# Patient Record
Sex: Male | Born: 1970 | Race: White | Hispanic: No | Marital: Married | Smoking: Never smoker
Health system: Southern US, Community
[De-identification: ages and names within clinical notes are randomized; demographics above are authoritative.]

## PROBLEM LIST (undated history)

## (undated) DIAGNOSIS — K219 Gastro-esophageal reflux disease without esophagitis: Secondary | ICD-10-CM

## (undated) DIAGNOSIS — Z87442 Personal history of urinary calculi: Secondary | ICD-10-CM

## (undated) DIAGNOSIS — K5282 Eosinophilic colitis: Secondary | ICD-10-CM

## (undated) DIAGNOSIS — I10 Essential (primary) hypertension: Secondary | ICD-10-CM

## (undated) DIAGNOSIS — R011 Cardiac murmur, unspecified: Secondary | ICD-10-CM

## (undated) HISTORY — DX: Essential (primary) hypertension: I10

---

## 2007-06-20 ENCOUNTER — Emergency Department: Payer: Self-pay | Admitting: Emergency Medicine

## 2007-11-18 ENCOUNTER — Ambulatory Visit: Payer: Self-pay | Admitting: Family Medicine

## 2007-11-18 DIAGNOSIS — Z8679 Personal history of other diseases of the circulatory system: Secondary | ICD-10-CM | POA: Insufficient documentation

## 2007-11-18 DIAGNOSIS — I10 Essential (primary) hypertension: Secondary | ICD-10-CM

## 2007-11-23 LAB — CONVERTED CEMR LAB
AST: 18 units/L (ref 0–37)
Albumin: 4.2 g/dL (ref 3.5–5.2)
BUN: 12 mg/dL (ref 6–23)
Basophils Absolute: 0 10*3/uL (ref 0.0–0.1)
Basophils Relative: 0.3 % (ref 0.0–1.0)
Calcium: 9.3 mg/dL (ref 8.4–10.5)
Chloride: 106 meq/L (ref 96–112)
Cholesterol: 184 mg/dL (ref 0–200)
Creatinine, Ser: 1 mg/dL (ref 0.4–1.5)
Eosinophils Absolute: 0.1 10*3/uL (ref 0.0–0.7)
Eosinophils Relative: 1.6 % (ref 0.0–5.0)
GFR calc non Af Amer: 89 mL/min
HCT: 47.1 % (ref 39.0–52.0)
Hemoglobin: 15.7 g/dL (ref 13.0–17.0)
MCHC: 33.4 g/dL (ref 30.0–36.0)
MCV: 84.1 fL (ref 78.0–100.0)
Monocytes Absolute: 0.4 10*3/uL (ref 0.1–1.0)
Neutro Abs: 4.7 10*3/uL (ref 1.4–7.7)
RBC: 5.6 M/uL (ref 4.22–5.81)
TSH: 0.79 microintl units/mL (ref 0.35–5.50)
Total Bilirubin: 1.1 mg/dL (ref 0.3–1.2)
VLDL: 32 mg/dL (ref 0–40)
WBC: 6.4 10*3/uL (ref 4.5–10.5)

## 2007-12-23 ENCOUNTER — Ambulatory Visit: Payer: Self-pay | Admitting: Family Medicine

## 2008-03-23 ENCOUNTER — Ambulatory Visit: Payer: Self-pay | Admitting: Family Medicine

## 2008-03-23 LAB — CONVERTED CEMR LAB
Cholesterol: 175 mg/dL (ref 0–200)
HDL: 32.5 mg/dL — ABNORMAL LOW (ref >39.0)
VLDL: 21 mg/dL (ref 0–40)

## 2008-03-30 ENCOUNTER — Ambulatory Visit: Payer: Self-pay | Admitting: Family Medicine

## 2008-04-04 LAB — CONVERTED CEMR LAB
HCV Ab: NEGATIVE
Hep B C IgM: NEGATIVE
Hepatitis B Surface Ag: NEGATIVE

## 2008-06-22 ENCOUNTER — Ambulatory Visit: Payer: Self-pay | Admitting: Family Medicine

## 2008-10-11 ENCOUNTER — Encounter (INDEPENDENT_AMBULATORY_CARE_PROVIDER_SITE_OTHER): Payer: Self-pay | Admitting: *Deleted

## 2008-12-14 ENCOUNTER — Ambulatory Visit: Payer: Self-pay | Admitting: Family Medicine

## 2008-12-14 DIAGNOSIS — E7800 Pure hypercholesterolemia, unspecified: Secondary | ICD-10-CM

## 2008-12-15 LAB — CONVERTED CEMR LAB
ALT: 26 units/L (ref 0–53)
AST: 23 units/L (ref 0–37)
Albumin: 4.3 g/dL (ref 3.5–5.2)
Alkaline Phosphatase: 63 units/L (ref 39–117)
Bilirubin, Direct: 0.1 mg/dL (ref 0.0–0.3)
CO2: 31 meq/L (ref 19–32)
Chloride: 107 meq/L (ref 96–112)
Glucose, Bld: 94 mg/dL (ref 70–99)
Sodium: 143 meq/L (ref 135–145)
Total CHOL/HDL Ratio: 4
Total Protein: 6.6 g/dL (ref 6.0–8.3)
Triglycerides: 38 mg/dL (ref 0.0–149.0)

## 2008-12-28 ENCOUNTER — Ambulatory Visit: Payer: Self-pay | Admitting: Family Medicine

## 2011-11-10 ENCOUNTER — Emergency Department: Payer: Self-pay | Admitting: Emergency Medicine

## 2011-11-10 LAB — URINALYSIS, COMPLETE
Bacteria: NONE SEEN
Bilirubin,UR: NEGATIVE
Glucose,UR: NEGATIVE mg/dL (ref 0–75)
Ketone: NEGATIVE
Leukocyte Esterase: NEGATIVE
Ph: 5 (ref 4.5–8.0)
Specific Gravity: 1.005 (ref 1.003–1.030)
WBC UR: <1 /HPF (ref 0–5)

## 2012-02-07 ENCOUNTER — Emergency Department: Payer: Self-pay | Admitting: *Deleted

## 2012-02-07 LAB — CBC
HCT: 45.7 % (ref 40.0–52.0)
MCV: 83 fL (ref 80–100)
Platelet: 189 10*3/uL (ref 150–440)
RBC: 5.48 10*6/uL (ref 4.40–5.90)
WBC: 5.9 10*3/uL (ref 3.8–10.6)

## 2012-02-07 LAB — BASIC METABOLIC PANEL
BUN: 19 mg/dL — ABNORMAL HIGH (ref 7–18)
Creatinine: 1.2 mg/dL (ref 0.60–1.30)
EGFR (Non-African Amer.): >60
Glucose: 92 mg/dL (ref 65–99)
Osmolality: 281 (ref 275–301)
Potassium: 3.6 mmol/L (ref 3.5–5.1)

## 2012-02-12 ENCOUNTER — Ambulatory Visit: Payer: Self-pay | Admitting: Family Medicine

## 2012-09-05 ENCOUNTER — Encounter: Payer: Self-pay | Admitting: Family Medicine

## 2012-09-05 ENCOUNTER — Ambulatory Visit (INDEPENDENT_AMBULATORY_CARE_PROVIDER_SITE_OTHER): Payer: BC Managed Care – PPO | Admitting: Family Medicine

## 2012-09-05 VITALS — BP 130/78 | HR 90 | Temp 98.2°F | Ht 69.0 in | Wt 191.5 lb

## 2012-09-05 DIAGNOSIS — K120 Recurrent oral aphthae: Secondary | ICD-10-CM

## 2012-09-05 NOTE — Progress Notes (Signed)
   Nature conservation officer at [facility] [address] [place] [address] Phone: [phone] Fax: [phone]  Date:  09/05/2012   Name:  Kyle Johnson   DOB:  1971/03/10   MRN:  [id number] Gender: male Age: 42 y.o.  PCP:  Kerby Nora, MD  Evaluating MD: Hannah Beat, MD   Chief Complaint: Sore Throat   History of Present Illness:  Kyle Johnson is a 42 y.o. pleasant patient who presents with the following:  apthous ulcer - has been there a few days, it is already improving. Mild pain. No systemic sx  Patient Active Problem List  Diagnosis  . PURE HYPERCHOLESTEROLEMIA  . HYPERTENSION  . HEART MURMUR, HX OF    Past Medical History  Diagnosis Date  . Hypertension     No past surgical history on file.  History  Substance Use Topics  . Smoking status: Never Smoker   . Smokeless tobacco: Not on file  . Alcohol Use: Not on file    Family History  Problem Relation Age of Onset  . Hypertension Mother   . Stroke Mother   . Heart disease Maternal Grandfather   . Dementia Maternal Grandfather     Allergies  Allergen Reactions  . Aspirin     REACTION: Nausea and vomiting  . Milk-Related Compounds     REACTION: GI  . Penicillins     REACTION: Nausea and vomiting  . Propoxyphene Hcl     REACTION: Rash    Medication list has been reviewed and updated.  No outpatient prescriptions prior to visit.    Last reviewed on 09/05/2012 11:46 AM by Consuello Masse, CMA  Review of Systems:  ROS: GEN: Acute illness details above GI: Tolerating PO intake GU: maintaining adequate hydration and urination Pulm: No SOB Interactive and getting along well at home.  Otherwise, ROS is as per the HPI.   Physical Examination: BP 130/78  Pulse 90  Temp 98.2 F (36.8 C) (Oral)  Ht 5\' 9"  (1.753 m)  Wt 191 lb 8 oz (86.864 kg)  BMI 28.28 kg/m2  SpO2 97%  Ideal Body Weight: Weight in (lb) to have BMI = 25: 168.9    GEN: WDWN, NAD, Non-toxic, Alert &  Oriented x 3 HEENT: Atraumatic, Normocephalic. Oropharynx clear. Posterior oropharynx with small apthous ulcer, L posterior. Ears and Nose: No external deformity. EXTR: No clubbing/cyanosis/edema NEURO: Normal gait.  PSYCH: Normally interactive. Conversant. Not depressed or anxious appearing.  Calm demeanor.    Assessment and Plan:  1. Aphthous ulcer of mouth   reassurance  Orders Today:  No orders of the defined types were placed in this encounter.    Updated Medication List: (Includes new medications, updates to list, dose adjustments) No orders of the defined types were placed in this encounter.    Medications Discontinued: There are no discontinued medications.   Hannah Beat, MD

## 2012-11-24 ENCOUNTER — Encounter: Payer: Self-pay | Admitting: Family Medicine

## 2012-11-24 ENCOUNTER — Ambulatory Visit (INDEPENDENT_AMBULATORY_CARE_PROVIDER_SITE_OTHER): Payer: BC Managed Care – PPO | Admitting: Family Medicine

## 2012-11-24 VITALS — BP 120/84 | HR 86 | Temp 98.2°F | Ht 69.0 in | Wt 188.8 lb

## 2012-11-24 DIAGNOSIS — J309 Allergic rhinitis, unspecified: Secondary | ICD-10-CM

## 2012-11-24 DIAGNOSIS — J069 Acute upper respiratory infection, unspecified: Secondary | ICD-10-CM | POA: Insufficient documentation

## 2012-11-24 MED ORDER — MUCINEX DM 30-600 MG PO TB12: 1.0000 | ORAL_TABLET | Freq: Two times a day (BID) | ORAL | Status: DC

## 2012-11-24 MED ORDER — CETIRIZINE HCL 10 MG PO CAPS: 1.0000 | ORAL_CAPSULE | Freq: Every day | ORAL | Status: DC

## 2012-11-24 NOTE — Addendum Note (Signed)
Addended by: Consuello Masse on: 11/24/2012 10:24 AM   Modules accepted: Orders

## 2012-11-24 NOTE — Assessment & Plan Note (Signed)
Definitely a component of current illness. Start zyrtec at bedtime.

## 2012-11-24 NOTE — Patient Instructions (Addendum)
Mucinex DM during the day. Nasal saline spray 2 sprays per nostril daily.. Zyrtec at bedtime for allergy component. Call if not improving as expected, call sooner if shortness of breath.

## 2012-11-24 NOTE — Progress Notes (Signed)
  Subjective:    Patient ID: Kyle Johnson, male    DOB: 28-Jul-1971, 42 y.o.   MRN: [id number]  Sinus Problem This is a new problem. The current episode started in the past 7 days (2 days ago). The problem has been gradually worsening since onset. There has been no fever. Associated symptoms include congestion, coughing, headaches and sneezing. Pertinent negatives include no shortness of breath, sinus pressure or sore throat. (Post nasal drip  watery eyes, pressure in ears  chest heaviness) Past treatments include oral decongestants (benadryl sinus). The treatment provided moderate relief.      Review of Systems  HENT: Positive for congestion and sneezing. Negative for sore throat and sinus pressure.   Respiratory: Positive for cough. Negative for shortness of breath.   Neurological: Positive for headaches.       Objective:   Physical Exam  Constitutional: Vital signs are normal. He appears well-developed and well-nourished.  Non-toxic appearance. He does not appear ill. No distress.  HENT:  Head: Normocephalic and atraumatic.  Right Ear: Hearing, tympanic membrane, external ear and ear canal normal. No tenderness. No foreign bodies. Tympanic membrane is not retracted and not bulging.  Left Ear: Hearing, tympanic membrane, external ear and ear canal normal. No tenderness. No foreign bodies. Tympanic membrane is not retracted and not bulging.  Nose: Mucosal edema and rhinorrhea present. Right sinus exhibits no maxillary sinus tenderness and no frontal sinus tenderness. Left sinus exhibits no maxillary sinus tenderness and no frontal sinus tenderness.  Mouth/Throat: Uvula is midline, oropharynx is clear and moist and mucous membranes are normal. Normal dentition. No dental caries. No oropharyngeal exudate or tonsillar abscesses.  Post nasal drip  Eyes: Conjunctivae, EOM and lids are normal. Pupils are equal, round, and reactive to light. No foreign bodies found.  Neck: Trachea normal,  normal range of motion and phonation normal. Neck supple. Carotid bruit is not present. No mass and no thyromegaly present.  Cardiovascular: Normal rate, regular rhythm, S1 normal, S2 normal, normal heart sounds, intact distal pulses and normal pulses.  Exam reveals no gallop.   No murmur heard. Pulmonary/Chest: Effort normal and breath sounds normal. No respiratory distress. He has no wheezes. He has no rhonchi. He has no rales.  Abdominal: Soft. Normal appearance and bowel sounds are normal. There is no hepatosplenomegaly. There is no tenderness. There is no rebound, no guarding and no CVA tenderness. No hernia.  Neurological: He is alert. He has normal reflexes.  Skin: Skin is warm, dry and intact. No rash noted.  Psychiatric: He has a normal mood and affect. His speech is normal and behavior is normal. Judgment normal.          Assessment & Plan:

## 2012-11-24 NOTE — Assessment & Plan Note (Signed)
Symptomatic care. No sign of bacterial infection. 

## 2013-07-12 ENCOUNTER — Emergency Department: Payer: Self-pay | Admitting: Emergency Medicine

## 2013-07-12 ENCOUNTER — Ambulatory Visit (INDEPENDENT_AMBULATORY_CARE_PROVIDER_SITE_OTHER): Payer: BC Managed Care – PPO | Admitting: Family Medicine

## 2013-07-12 ENCOUNTER — Encounter: Payer: Self-pay | Admitting: Family Medicine

## 2013-07-12 VITALS — BP 140/90 | HR 69 | Temp 98.0°F | Ht 69.0 in | Wt 199.5 lb

## 2013-07-12 DIAGNOSIS — J323 Chronic sphenoidal sinusitis: Secondary | ICD-10-CM

## 2013-07-12 DIAGNOSIS — H6980 Other specified disorders of Eustachian tube, unspecified ear: Secondary | ICD-10-CM

## 2013-07-12 DIAGNOSIS — H6981 Other specified disorders of Eustachian tube, right ear: Secondary | ICD-10-CM

## 2013-07-12 MED ORDER — OXYMETAZOLINE HCL 0.05 % NA SOLN: 1.0000 | Freq: Two times a day (BID) | NASAL | Status: DC | PRN

## 2013-07-12 MED ORDER — AZITHROMYCIN 250 MG PO TABS: ORAL_TABLET | ORAL | Status: DC

## 2013-07-12 NOTE — Progress Notes (Signed)
Pre-visit discussion using our clinic review tool. No additional management support is needed unless otherwise documented below in the visit note.  

## 2013-07-12 NOTE — Progress Notes (Signed)
Date:  07/12/2013   Name:  Kyle Johnson   DOB:  23-Dec-1970   MRN:  [id number] Gender: male Age: 42 y.o.  Primary Physician:  Kerby Nora, MD   Chief Complaint: Otalgia and Jaw Pain   Subjective:   History of Present Illness:  Kyle Johnson is a 42 y.o. very pleasant male patient who presents with the following:  The patient has been sick the last week, and is having a lot of pain in his right ear, in his right upper maxillary region primarily and some behind his eyes and laterally. He has had no trauma. He is not running a fever. He used to drink normally. He is hearing some popping in his ear as well  R ear pain and in the ear pain. Thought maybe sinus  Teeth do not hurt.  O/w healthy  Sphenoid sinusitis R   Past Medical History, Surgical History, Social History, Family History, Problem List, Medications, and Allergies have been reviewed and updated if relevant.  Review of Systems: ROS: GEN: Acute illness details above GI: Tolerating PO intake GU: maintaining adequate hydration and urination Pulm: No SOB Interactive and getting along well at home.  Otherwise, ROS is as per the HPI.   Objective:   Physical Examination: BP 140/90  Pulse 69  Temp(Src) 98 F (36.7 C) (Oral)  Ht 5\' 9"  (1.753 m)  Wt 199 lb 8 oz (90.493 kg)  BMI 29.45 kg/m2   Gen: WDWN, NAD; alert,appropriate and cooperative throughout exam  HEENT: Normocephalic and atraumatic. Throat clear, w/o exudate, no LAD, R TM clear - some fluid, L TM - good landmarks, No fluid present. rhinnorhea.  Left frontal and maxillary sinuses: nonTender Right frontal and maxillary sinuses: Tender max, mild  Neck: No ant or post LAD CV: RRR, No M/G/R Pulm: Breathing comfortably in no resp distress. no w/c/r Abd: S,NT,ND,+BS Extr: no c/c/e Psych: full affect, pleasant    Laboratory and Imaging Data:  Assessment & Plan:    Sphenoid sinusitis  Eustachian tube dysfunction, right  . I suspect  he is having accommodation of eustachian tube dysfunction and likely some sphenoid sinusitis based on his presentation and location of his pain. Symptomatic care and by mouth antibiotics  Patient Instructions   Recommendations: Afrin Nasal Spray: 2 sprays twice a day for a maximum of 3-4 days (longer than this and your nose gets addicted, and you have rebound swelling that makes it worse.)  Sudafed: Either pseudoephedrine or phenylephrine. As directed on box. (Not is heart problems or high blood pressure)   Orders Today:  No orders of the defined types were placed in this encounter.    New medications, updates to list, dose adjustments: Meds ordered this encounter  Medications  . azithromycin (ZITHROMAX Z-PAK) 250 MG tablet    Sig: Take 2 tablets (500 mg) on  Day 1,  followed by 1 tablet (250 mg) once daily on Days 2 through 5.    Dispense:  6 each    Refill:  0  . oxymetazoline (AFRIN NASAL SPRAY) 0.05 % nasal spray    Sig: Place 1 spray into both nostrils 2 (two) times daily as needed for congestion.    Dispense:  30 mL    Refill:  2    Signed,  Latasha Buczkowski T. Chiara Coltrin, MD, CAQ Sports Medicine  [facility] [address] [place] [address] Phone: [phone] Fax: [phone]  Updated Complete Medication List:   Medication List  This list is accurate as of: 07/12/13  6:14 PM.  Always use your most recent med list.               azithromycin 250 MG tablet  Commonly known as:  ZITHROMAX Z-PAK  Take 2 tablets (500 mg) on  Day 1,  followed by 1 tablet (250 mg) once daily on Days 2 through 5.     Cetirizine HCl 10 MG Caps  Commonly known as:  ZYRTEC ALLERGY  Take 1 capsule (10 mg total) by mouth daily.     oxymetazoline 0.05 % nasal spray  Commonly known as:  AFRIN NASAL SPRAY  Place 1 spray into both nostrils 2 (two) times daily as needed for congestion.

## 2013-07-12 NOTE — Patient Instructions (Signed)
  Recommendations: Afrin Nasal Spray: 2 sprays twice a day for a maximum of 3-4 days (longer than this and your nose gets addicted, and you have rebound swelling that makes it worse.)  Sudafed: Either pseudoephedrine or phenylephrine. As directed on box. (Not is heart problems or high blood pressure)

## 2013-07-13 ENCOUNTER — Emergency Department: Payer: Self-pay | Admitting: Emergency Medicine

## 2013-07-13 LAB — COMPREHENSIVE METABOLIC PANEL
Albumin: 4.4 g/dL (ref 3.4–5.0)
Alkaline Phosphatase: 95 U/L
Anion Gap: 8 (ref 7–16)
Bilirubin,Total: 0.6 mg/dL (ref 0.2–1.0)
Calcium, Total: 9.4 mg/dL (ref 8.5–10.1)
Chloride: 103 mmol/L (ref 98–107)
Co2: 27 mmol/L (ref 21–32)
Creatinine: 0.98 mg/dL (ref 0.60–1.30)
EGFR (African American): >60
EGFR (Non-African Amer.): >60
Potassium: 3.9 mmol/L (ref 3.5–5.1)
SGOT(AST): 27 U/L (ref 15–37)
SGPT (ALT): 48 U/L (ref 12–78)
Sodium: 138 mmol/L (ref 136–145)
Total Protein: 7.9 g/dL (ref 6.4–8.2)

## 2013-07-13 LAB — CBC
MCH: 28.7 pg (ref 26.0–34.0)
MCHC: 34.5 g/dL (ref 32.0–36.0)
Platelet: 192 10*3/uL (ref 150–440)
RBC: 5.8 10*6/uL (ref 4.40–5.90)
WBC: 10 10*3/uL (ref 3.8–10.6)

## 2014-01-16 ENCOUNTER — Encounter (INDEPENDENT_AMBULATORY_CARE_PROVIDER_SITE_OTHER): Payer: Self-pay

## 2014-01-16 ENCOUNTER — Ambulatory Visit (INDEPENDENT_AMBULATORY_CARE_PROVIDER_SITE_OTHER): Payer: BC Managed Care – PPO | Admitting: Family Medicine

## 2014-01-16 ENCOUNTER — Encounter: Payer: Self-pay | Admitting: Family Medicine

## 2014-01-16 VITALS — BP 134/78 | HR 77 | Temp 97.8°F | Ht 68.75 in | Wt 188.8 lb

## 2014-01-16 DIAGNOSIS — T679XXA Effect of heat and light, unspecified, initial encounter: Secondary | ICD-10-CM | POA: Insufficient documentation

## 2014-01-16 LAB — BASIC METABOLIC PANEL
BUN: 22 mg/dL (ref 6–23)
CHLORIDE: 103 meq/L (ref 96–112)
CO2: 28 meq/L (ref 19–32)
Calcium: 9.3 mg/dL (ref 8.4–10.5)
Creatinine, Ser: 1.1 mg/dL (ref 0.4–1.5)
GFR: 81.48 mL/min (ref >60.00)
GLUCOSE: 141 mg/dL — AB (ref 70–99)
POTASSIUM: 3.5 meq/L (ref 3.5–5.1)
SODIUM: 139 meq/L (ref 135–145)

## 2014-01-16 NOTE — Patient Instructions (Signed)
Ask about keeping a cooler of ice water at work (on the truck). Drink enough to keep your urine clear or light colored.  Try get a pair of lightweight pants.  If you get too hot, then stop and get a cold rag on your neck.  Go to the lab on the way out.  We'll contact you with your lab report.

## 2014-01-16 NOTE — Assessment & Plan Note (Signed)
H/o cramping and likely relatively heavy salt load in perspiration.   See instructions and labs.   Normal exam today, sx resolved. Nontoxic.   D/w pt about prevention of sx.

## 2014-01-16 NOTE — Progress Notes (Signed)
Pre visit review using our clinic review tool, if applicable. No additional management support is needed unless otherwise documented below in the visit note.  He was out moving the grass about 1-2 weeks ago.  He was working long hours in the heat, "felt bad" afterward.  The next days he didn't feel well, felt tired and "exhausted."   Still didn't feel well by the following Monday but then sx resolved.    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  Felt overly sweaty.  Had to quit the work and put a cold rag on his neck.  Sat down in the shop, then started vomiting.  Got home last night.  Was able to rest last night, drank some gatorade.  This AM he did feel better.  No CP, SOB, BLE edema.  No LOC.    He tends to leave a salt ring on his hat from sweat.   Meds, vitals, and allergies reviewed.   ROS: See HPI.  Otherwise, noncontributory.  GEN: nad, alert and oriented HEENT: mucous membranes moist NECK: supple w/o LA CV: rrr.  no murmur PULM: ctab, no inc wob ABD: soft, +bs EXT: no edema SKIN: no acute rash

## 2014-01-17 ENCOUNTER — Encounter: Payer: Self-pay | Admitting: Family Medicine

## 2016-07-29 ENCOUNTER — Encounter: Payer: Self-pay | Admitting: Family Medicine

## 2016-07-29 ENCOUNTER — Ambulatory Visit (INDEPENDENT_AMBULATORY_CARE_PROVIDER_SITE_OTHER): Payer: BC Managed Care – PPO | Admitting: Family Medicine

## 2016-07-29 VITALS — BP 156/98 | HR 94 | Temp 98.4°F | Wt 202.8 lb

## 2016-07-29 DIAGNOSIS — I10 Essential (primary) hypertension: Secondary | ICD-10-CM | POA: Diagnosis not present

## 2016-07-29 DIAGNOSIS — R197 Diarrhea, unspecified: Secondary | ICD-10-CM | POA: Insufficient documentation

## 2016-07-29 LAB — COMPREHENSIVE METABOLIC PANEL
ALBUMIN: 4.9 g/dL (ref 3.5–5.2)
ALT: 31 U/L (ref 0–53)
AST: 19 U/L (ref 0–37)
Alkaline Phosphatase: 84 U/L (ref 39–117)
BUN: 15 mg/dL (ref 6–23)
CALCIUM: 9.6 mg/dL (ref 8.4–10.5)
CHLORIDE: 104 meq/L (ref 96–112)
CO2: 29 meq/L (ref 19–32)
CREATININE: 1.02 mg/dL (ref 0.40–1.50)
GFR: 83.31 mL/min (ref >60.00)
Glucose, Bld: 98 mg/dL (ref 70–99)
Potassium: 4.2 mEq/L (ref 3.5–5.1)
Sodium: 140 mEq/L (ref 135–145)
Total Bilirubin: 0.9 mg/dL (ref 0.2–1.2)
Total Protein: 7.2 g/dL (ref 6.0–8.3)

## 2016-07-29 LAB — CBC
HEMATOCRIT: 47.9 % (ref 39.0–52.0)
Hemoglobin: 16.4 g/dL (ref 13.0–17.0)
MCHC: 34.2 g/dL (ref 30.0–36.0)
MCV: 83.5 fl (ref 78.0–100.0)
PLATELETS: 216 10*3/uL (ref 150.0–400.0)
RBC: 5.74 Mil/uL (ref 4.22–5.81)
RDW: 12.8 % (ref 11.5–15.5)
WBC: 7.8 10*3/uL (ref 4.0–10.5)

## 2016-07-29 LAB — TSH: TSH: 1.74 u[IU]/mL (ref 0.35–4.50)

## 2016-07-29 LAB — HEMOGLOBIN A1C: HEMOGLOBIN A1C: 5.4 % (ref 4.6–6.5)

## 2016-07-29 MED ORDER — AMLODIPINE BESYLATE 5 MG PO TABS: 5.0000 mg | ORAL_TABLET | Freq: Every day | ORAL | 0 refills | Status: DC

## 2016-07-29 NOTE — Progress Notes (Signed)
  Tommi Rumps, MD Phone: [phone]  Kyle Johnson is a 45 y.o. male who presents today for same-day visit.  Patient notes onset of diarrhea 4 days ago. Notes some stomach upset with this though is no abdominal pain. No blood in stool. No nausea or vomiting. No new medications. Has not been drinking out of rivers or creeks. No sick contacts with this. Did eat some cookies from [employer] the day this started though no new foods. He notes he has progressively been improving. Still feels like his stomach is somewhat upset though his bowel movements are more formed today.  Hypertension: Patient intermittently rarely checks. Typically in the 170s over 80s when he does. No chest pain, shortness breath, swelling, numbness, weakness, or vision changes. Has had a mild headache last couple of days with the diarrhea. He is not on medications for his blood pressure.  PMH: nonsmoker.   ROS see history of present illness  Objective  Physical Exam Vitals:   07/29/16 1343 07/29/16 1410  BP: (!) 178/110 (!) 156/98  Pulse: 94   Temp: 98.4 F (36.9 C)     BP Readings from Last 3 Encounters:  07/29/16 (!) 156/98  01/16/14 134/78  07/12/13 140/90   Wt Readings from Last 3 Encounters:  07/29/16 202 lb 12.8 oz (92 kg)  01/16/14 188 lb 12 oz (85.6 kg)  07/12/13 199 lb 8 oz (90.5 kg)    Physical Exam  Constitutional: He is well-developed, well-nourished, and in no distress.  HENT:  Mouth/Throat: Oropharynx is clear and moist. No oropharyngeal exudate.  Eyes: Conjunctivae are normal. Pupils are equal, round, and reactive to light.  Cardiovascular: Normal rate, regular rhythm and normal heart sounds.   Pulmonary/Chest: Effort normal and breath sounds normal.  Abdominal: Soft. Bowel sounds are normal. He exhibits no distension. There is no tenderness. There is no rebound and no guarding.  Neurological: He is alert.  CN 2-12 intact, 5/5 strength in bilateral biceps, triceps, grip, quads,  hamstrings, plantar and dorsiflexion, sensation to light touch intact in bilateral UE and LE, normal gait, 2+ patellar reflexes  Skin: Skin is warm.     Assessment/Plan: Please see individual problem list.  Essential hypertension Uncontrolled. Has not been evaluated in a number of years. High at home as well. Currently asymptomatic. Neurologically intact. We'll check lab work as outlined below. We'll start on amlodipine. He'll follow-up in 2 weeks with his PCP for reevaluation.  Diarrhea Suspect patient's diarrhea likely related to food borne illness or viral illness given the relatively short duration and improvement at this time. Benign abdominal exam. We'll check lab work as outlined below. Encouraged hydration. If does not continue to improve he'll follow-up. Given return precautions.   Orders Placed This Encounter  Procedures  . Comp Met (CMET)  . CBC  . HgB A1c  . TSH    Meds ordered this encounter  Medications  . amLODipine (NORVASC) 5 MG tablet    Sig: Take 1 tablet (5 mg total) by mouth daily.    Dispense:  90 tablet    Refill:  0    Tommi Rumps, MD [place]

## 2016-07-29 NOTE — Progress Notes (Signed)
Pre visit review using our clinic review tool, if applicable. No additional management support is needed unless otherwise documented below in the visit note. 

## 2016-07-29 NOTE — Assessment & Plan Note (Signed)
Uncontrolled. Has not been evaluated in a number of years. High at home as well. Currently asymptomatic. Neurologically intact. We'll check lab work as outlined below. We'll start on amlodipine. He'll follow-up in 2 weeks with his PCP for reevaluation.

## 2016-07-29 NOTE — Assessment & Plan Note (Signed)
Suspect patient's diarrhea likely related to food borne illness or viral illness given the relatively short duration and improvement at this time. Benign abdominal exam. We'll check lab work as outlined below. Encouraged hydration. If does not continue to improve he'll follow-up. Given return precautions.

## 2016-07-29 NOTE — Patient Instructions (Signed)
Nice to meet you. We will start you on amlodipine for your blood pressure. We will check some lab work as well. Please monitor your diarrhea and if it does not continue to improve please let us know. I would like you to stay out of work until tomorrow and I provided you with a note for this. If you develop abdominal pain, blood in her stool, chest pain, shortness of breath, numbness, weakness, or any new or changing symptoms please seek medical attention immediately.

## 2016-07-30 ENCOUNTER — Ambulatory Visit: Payer: BC Managed Care – PPO | Admitting: Family Medicine

## 2016-08-14 ENCOUNTER — Encounter: Payer: Self-pay | Admitting: Family Medicine

## 2016-08-14 ENCOUNTER — Encounter: Payer: Self-pay | Admitting: *Deleted

## 2016-08-14 ENCOUNTER — Ambulatory Visit (INDEPENDENT_AMBULATORY_CARE_PROVIDER_SITE_OTHER): Payer: BC Managed Care – PPO | Admitting: Family Medicine

## 2016-08-14 VITALS — BP 138/82 | HR 69 | Temp 97.5°F | Wt 199.0 lb

## 2016-08-14 DIAGNOSIS — I10 Essential (primary) hypertension: Secondary | ICD-10-CM | POA: Diagnosis not present

## 2016-08-14 DIAGNOSIS — E7800 Pure hypercholesterolemia, unspecified: Secondary | ICD-10-CM

## 2016-08-14 LAB — LIPID PANEL
CHOLESTEROL: 190 mg/dL (ref 0–200)
HDL: 32.3 mg/dL — ABNORMAL LOW (ref >39.00)
LDL Cholesterol: 123 mg/dL — ABNORMAL HIGH (ref 0–99)
NonHDL: 158.04
Total CHOL/HDL Ratio: 6
Triglycerides: 173 mg/dL — ABNORMAL HIGH (ref 0.0–149.0)
VLDL: 34.6 mg/dL (ref 0.0–40.0)

## 2016-08-14 NOTE — Patient Instructions (Signed)
Keep working on healthy eating, weight loss and regular exercise.  Follow blood pressure at home.. Goal 90-140/60-90.  Stop at lab on way out for cholesterol check.

## 2016-08-14 NOTE — Assessment & Plan Note (Signed)
Improved control on amlodipine. Encouraged exercise, weight loss, healthy eating habits.

## 2016-08-14 NOTE — Progress Notes (Signed)
Pre visit review using our clinic review tool, if applicable. No additional management support is needed unless otherwise documented below in the visit note. 

## 2016-08-14 NOTE — Assessment & Plan Note (Signed)
Due for recheck

## 2016-08-14 NOTE — Progress Notes (Signed)
   Subjective:    Patient ID: Kyle Johnson, male    DOB: 10-12-1970, 45 y.o.   MRN: [id number]  HPI  45 year old male presents for follow up blood pressure.  he was seen with elevated blood pressure ( as well as diarrhea) by Dr. Josephina Gip on 12/13. At that OV diarrhea felt to be acute GI illness likely viral in nature. He reports today that diarrhea is much better. Started on amlodipine 5 mg daily For HTN and evaluated with labs. Tsh, cbc, a1C nml, CMET nml  His blood pressure is better today.Marland Kitchen ahs not been able to check at home.  no chest  Pain, no SOB. NO SE to amlodipoine.. No edema. He had been anxious for last few years.. Feel much better on amlodipine. No depression, no SI. Working on job changes which should help.  He has been doing better with healthy eating and low salt diet. Planning on getting back to exercise. BP Readings from Last 3 Encounters:  08/14/16 138/82  07/29/16 (!) 156/98  01/16/14 134/78   Wt Readings from Last 3 Encounters:  08/14/16 199 lb (90.3 kg)  07/29/16 202 lb 12.8 oz (92 kg)  01/16/14 188 lb 12 oz (85.6 kg)   Body mass index is 29.6 kg/m.    Review of Systems  Constitutional: Negative for fatigue and fever.  HENT: Negative for ear pain.   Eyes: Negative for pain.  Respiratory: Negative for cough and shortness of breath.   Cardiovascular: Negative for chest pain, palpitations and leg swelling.       Objective:   Physical Exam  Constitutional: Vital signs are normal. He appears well-developed and well-nourished.  HENT:  Head: Normocephalic.  Right Ear: Hearing normal.  Left Ear: Hearing normal.  Nose: Nose normal.  Mouth/Throat: Oropharynx is clear and moist and mucous membranes are normal.  Neck: Trachea normal. Carotid bruit is not present. No thyroid mass and no thyromegaly present.  Cardiovascular: Normal rate, regular rhythm and normal pulses.  Exam reveals no gallop, no distant heart sounds and no friction rub.   No murmur  heard. No peripheral edema  Pulmonary/Chest: Effort normal and breath sounds normal. No respiratory distress.  Skin: Skin is warm, dry and intact. No rash noted.  Psychiatric: He has a normal mood and affect. His speech is normal and behavior is normal. Thought content normal.          Assessment & Plan:

## 2016-09-04 ENCOUNTER — Telehealth: Payer: Self-pay | Admitting: *Deleted

## 2016-09-04 MED ORDER — OSELTAMIVIR PHOSPHATE 75 MG PO CAPS: 75.0000 mg | ORAL_CAPSULE | Freq: Two times a day (BID) | ORAL | 0 refills | Status: DC

## 2016-09-04 NOTE — Telephone Encounter (Signed)
Patient notified and verbalized understanding. 

## 2016-09-04 NOTE — Telephone Encounter (Signed)
Pt not at risk for flu complications. Don't recommend preventative treatment.  I have sent in tamiflu Rx but only recommend he take if he develops flu symptoms of fever >101, cough or body aches.

## 2016-09-04 NOTE — Telephone Encounter (Signed)
Wife came in to office and had a positive flu. Was asking about tamiflu prophylactic for PT. Please call.

## 2016-09-23 ENCOUNTER — Encounter: Payer: Self-pay | Admitting: Internal Medicine

## 2016-09-23 ENCOUNTER — Ambulatory Visit (INDEPENDENT_AMBULATORY_CARE_PROVIDER_SITE_OTHER): Payer: BC Managed Care – PPO | Admitting: Internal Medicine

## 2016-09-23 VITALS — BP 130/82 | HR 92 | Temp 98.2°F | Wt 198.0 lb

## 2016-09-23 DIAGNOSIS — J069 Acute upper respiratory infection, unspecified: Secondary | ICD-10-CM

## 2016-09-23 DIAGNOSIS — B9789 Other viral agents as the cause of diseases classified elsewhere: Secondary | ICD-10-CM

## 2016-09-23 NOTE — Progress Notes (Signed)
HPI  Pt presents to the clinic today with c/o nasal congestion and sore throat. This started yesterday. He is blowing clear mucous out of his nose. He denies difficulty swallowing. He denies ear pain or cough. He denies fever, chills or body aches. He has not taken anything OTC for this. He has no history of allergies or breathing problems. He has no history of allergies or breathing problems. He may have had sick contacts.  Review of Systems        Past Medical History:  Diagnosis Date  . Hypertension     Family History  Problem Relation Age of Onset  . Hypertension Mother   . Stroke Mother   . Heart disease Maternal Grandfather   . Dementia Maternal Grandfather     Social History   Social History  . Marital status: Married    Spouse name: N/A  . Number of children: N/A  . Years of education: N/A   Occupational History  . Not on file.   Social History Main Topics  . Smoking status: Never Smoker  . Smokeless tobacco: Never Used  . Alcohol use No  . Drug use: No  . Sexual activity: Not on file   Other Topics Concern  . Not on file   Social History Narrative  . No narrative on file    Allergies  Allergen Reactions  . Aspirin     REACTION: Nausea and vomiting  . Ibuprofen Other (See Comments)    Pt reports he does not remember why he can not take   . Milk-Related Compounds     REACTION: GI  . Penicillins     REACTION: Nausea and vomiting  . Propoxyphene Hcl     REACTION: Rash     Constitutional: Denies headache, fatigue, fever or abrupt weight changes.  HEENT:  Positive nasal congestion, sore throat. Denies eye redness, eye pain, pressure behind the eyes, facial pain, ear pain, ringing in the ears, wax buildup, runny nose or bloody nose. Respiratory:  Denies cough, difficulty breathing or shortness of breath.  Cardiovascular: Denies chest pain, chest tightness, palpitations or swelling in the hands or feet.   No other specific complaints in a complete  review of systems (except as listed in HPI above).  Objective:   BP 130/82   Pulse 92   Temp 98.2 F (36.8 C) (Oral)   Wt 198 lb (89.8 kg)   SpO2 98%   BMI 29.45 kg/m  Wt Readings from Last 3 Encounters:  09/23/16 198 lb (89.8 kg)  08/14/16 199 lb (90.3 kg)  07/29/16 202 lb 12.8 oz (92 kg)     General: Appears his stated age,  in NAD. HEENT: Head: normal shape and size, no sinus tenderness noted; Ears: Tm's pink but intact, normal light reflex, + serous effusion bilaterally; Nose: mucosa pink and moist, septum midline; Throat/Mouth: + PND. Teeth present, mucosa pink and moist, no exudate noted, no lesions or ulcerations noted.  Neck: No cervical lymphadenopathy.  Cardiovascular: Normal rate and rhythm.  Pulmonary/Chest: Normal effort and positive vesicular breath sounds. No respiratory distress. No wheezes, rales or ronchi noted.       Assessment & Plan:   Viral Upper Respiratory Infection:  Get some rest and drink plenty of water Do salt water gargles for the sore throat Start Zyrtec and Flonase OTC  RTC as needed or if symptoms persist.   Webb Silversmith, NP

## 2016-09-23 NOTE — Patient Instructions (Signed)
Upper Respiratory Infection, Adult Most upper respiratory infections (URIs) are caused by a virus. A URI affects the nose, throat, and upper air passages. The most common type of URI is often called "the common cold." Follow these instructions at home:  Take medicines only as told by your doctor.  Gargle warm saltwater or take cough drops to comfort your throat as told by your doctor.  Use a warm mist humidifier or inhale steam from a shower to increase air moisture. This may make it easier to breathe.  Drink enough fluid to keep your pee (urine) clear or pale yellow.  Eat soups and other clear broths.  Have a healthy diet.  Rest as needed.  Go back to work when your fever is gone or your doctor says it is okay.  You may need to stay home longer to avoid giving your URI to others.  You can also wear a face mask and wash your hands often to prevent spread of the virus.  Use your inhaler more if you have asthma.  Do not use any tobacco products, including cigarettes, chewing tobacco, or electronic cigarettes. If you need help quitting, ask your doctor. Contact a doctor if:  You are getting worse, not better.  Your symptoms are not helped by medicine.  You have chills.  You are getting more short of breath.  You have brown or red mucus.  You have yellow or brown discharge from your nose.  You have pain in your face, especially when you bend forward.  You have a fever.  You have puffy (swollen) neck glands.  You have pain while swallowing.  You have white areas in the back of your throat. Get help right away if:  You have very bad or constant:  Headache.  Ear pain.  Pain in your forehead, behind your eyes, and over your cheekbones (sinus pain).  Chest pain.  You have long-lasting (chronic) lung disease and any of the following:  Wheezing.  Long-lasting cough.  Coughing up blood.  A change in your usual mucus.  You have a stiff neck.  You have  changes in your:  Vision.  Hearing.  Thinking.  Mood. This information is not intended to replace advice given to you by your health care provider. Make sure you discuss any questions you have with your health care provider. Document Released: 01/20/2008 Document Revised: 04/05/2016 Document Reviewed: 11/08/2013 Elsevier Interactive Patient Education  2017 [employer] Inc.  

## 2016-09-24 ENCOUNTER — Telehealth: Payer: Self-pay

## 2016-09-24 NOTE — Telephone Encounter (Signed)
Patient returned Melanie's call. Patient can be reached at [phone].

## 2016-09-24 NOTE — Telephone Encounter (Signed)
Patient calls to request "more medication" to treat symptoms as they have changed since office visit yesterday.  Patient now complains of: Chest congestion (worse in am).  Productive cough with thick yellow sputum.  Denies fever, sore throat, head ache or body aches.  He would like to take Robitussin DM to help with cough and expectorating mucus from chest.    I did suggest either Robtiussin or Mucinex but he states he doesn't think mucinex works as well for him.  In addition, I encouraged him to drink plenty of fluids, especially water, to thin secretions to ease in expectorating.  Will forward to Webb Silversmith, NP (as she saw patient yesterday) for her recommendations and call patient back with any further direction.

## 2016-09-24 NOTE — Telephone Encounter (Signed)
Pt is aware as instructed 

## 2016-09-24 NOTE — Telephone Encounter (Signed)
Called pt, no answer and VM not set up 

## 2016-09-24 NOTE — Telephone Encounter (Signed)
My recommendations today are the same as they were yesterday. Use Flonase for the nasal congestion, Zyrtec to help dry up the mucous. And he is fine to take Robitussin DM if that is what he choose for cough syrup.

## 2016-10-19 ENCOUNTER — Other Ambulatory Visit: Payer: Self-pay | Admitting: *Deleted

## 2016-10-19 MED ORDER — AMLODIPINE BESYLATE 5 MG PO TABS: 5.0000 mg | ORAL_TABLET | Freq: Every day | ORAL | 1 refills | Status: DC

## 2016-11-06 ENCOUNTER — Ambulatory Visit (INDEPENDENT_AMBULATORY_CARE_PROVIDER_SITE_OTHER): Payer: BC Managed Care – PPO | Admitting: Family Medicine

## 2016-11-06 ENCOUNTER — Encounter: Payer: Self-pay | Admitting: Family Medicine

## 2016-11-06 ENCOUNTER — Encounter (INDEPENDENT_AMBULATORY_CARE_PROVIDER_SITE_OTHER): Payer: Self-pay

## 2016-11-06 VITALS — BP 136/72 | HR 92 | Temp 98.4°F | Ht 69.25 in | Wt 194.5 lb

## 2016-11-06 DIAGNOSIS — E7800 Pure hypercholesterolemia, unspecified: Secondary | ICD-10-CM | POA: Diagnosis not present

## 2016-11-06 DIAGNOSIS — Z23 Encounter for immunization: Secondary | ICD-10-CM | POA: Diagnosis not present

## 2016-11-06 DIAGNOSIS — R0789 Other chest pain: Secondary | ICD-10-CM | POA: Insufficient documentation

## 2016-11-06 DIAGNOSIS — Z0000 Encounter for general adult medical examination without abnormal findings: Secondary | ICD-10-CM

## 2016-11-06 DIAGNOSIS — I10 Essential (primary) hypertension: Secondary | ICD-10-CM

## 2016-11-06 DIAGNOSIS — Z0001 Encounter for general adult medical examination with abnormal findings: Secondary | ICD-10-CM | POA: Diagnosis not present

## 2016-11-06 NOTE — Patient Instructions (Addendum)
Keep working on healthy eating and regular exercise. Start chest wall stretching. Can use tyelnol for pain if needed.  Call if chest wall pain not improving as expected.

## 2016-11-06 NOTE — Assessment & Plan Note (Signed)
Tylenol and home stretching.  lung exam clear pt, VSS.

## 2016-11-06 NOTE — Progress Notes (Signed)
   Subjective:    Patient ID: Kyle Johnson, male    DOB: 1971-06-13, 46 y.o.   MRN: [id number]  HPI  The patient is here for annual wellness exam and preventative care.   He had new chest pain in last 2 days, off and on in right axillae. No related to exertion. No SOB, no dizziness. Occ cough. No fever. Does not hurt to take deep breaths.  No change with movement.   He has been having allergies in last month.Started zyrtec for nasal congestion.  Hypertension:    Good control on amlodipine 5 mg daily. BP Readings from Last 3 Encounters:  11/06/16 136/72  09/23/16 130/82  08/14/16 138/82  Using medication without problems or lightheadedness: none Chest pain with exertion:None Edema:none Short of breath:none Average home BPs: Other issues:  Working on healthy eating and exercise. Decreasing pork and bacon.  Social History /Family History/Past Medical History reviewed and updated if needed. Blood pressure 136/72, pulse 92, temperature 98.4 F (36.9 C), temperature source Oral, height 5' 9.25" (1.759 m), weight 194 lb 8 oz (88.2 kg).  Review of Systems  Constitutional: Negative for fatigue and fever.  HENT: Negative for ear pain.   Eyes: Negative for pain.  Respiratory: Negative for cough and shortness of breath.   Cardiovascular: Positive for chest pain. Negative for palpitations and leg swelling.  Gastrointestinal: Negative for abdominal pain.  Genitourinary: Negative for dysuria.  Musculoskeletal: Negative for arthralgias.  Neurological: Negative for syncope, light-headedness and headaches.  Psychiatric/Behavioral: Negative for dysphoric mood.       Objective:   Physical Exam  Constitutional: Vital signs are normal. He appears well-developed and well-nourished.  HENT:  Head: Normocephalic.  Right Ear: Hearing normal.  Left Ear: Hearing normal.  Nose: Nose normal.  Mouth/Throat: Oropharynx is clear and moist and mucous membranes are normal.  Neck: Trachea  normal. Carotid bruit is not present. No thyroid mass and no thyromegaly present.  Cardiovascular: Normal rate, regular rhythm and normal pulses.  Exam reveals no gallop, no distant heart sounds and no friction rub.   No murmur heard. No peripheral edema  Pulmonary/Chest: Effort normal and breath sounds normal. No respiratory distress. He exhibits no mass, no tenderness and no bony tenderness.  Skin: Skin is warm, dry and intact. No rash noted.  Psychiatric: He has a normal mood and affect. His speech is normal and behavior is normal. Thought content normal.          Assessment & Plan:  The patient's preventative maintenance and recommended screening tests for an annual wellness exam were reviewed in full today. Brought up to date unless services declined.  Counselled on the importance of diet, exercise, and its role in overall health and mortality. The patient's FH and SH was reviewed, including their home life, tobacco status, and drug and alcohol status.    Vaccines: Given Tdap today Prostate Cancer Screen: no early fam history Colon Cancer Screen: no early fam history      Smoking Status: none ETOH/ drug use: none   HIV screen:   refused.

## 2016-11-06 NOTE — Assessment & Plan Note (Signed)
Well controlled. Continue current medication.  

## 2016-11-06 NOTE — Progress Notes (Signed)
Pre visit review using our clinic review tool, if applicable. No additional management support is needed unless otherwise documented below in the visit note. 

## 2016-11-06 NOTE — Assessment & Plan Note (Signed)
Encouraged exercise, weight loss, healthy eating habits. ? ?

## 2016-11-10 ENCOUNTER — Telehealth: Payer: Self-pay | Admitting: Family Medicine

## 2016-11-10 NOTE — Telephone Encounter (Signed)
Patient Name: Kyle Johnson\ DOB: [birth year] Initial Comment Caller states husband was seen Friday for Tetnis shot. Now he is sick at stomach and arm swollen at site, feeling like a huge knot is under the skin. Nurse Assessment Nurse: Emilio Math, RN, Estill Bamberg Date/Time (Eastern Time): 11/10/2016 11:50:34 AM Confirm and document reason for call. If symptomatic, describe symptoms. ---Caller states husband was in Friday for physical. He received a tetanus shot. Saturday, noticed a red knot to injection site. Progressively gotten worse. This am experiencing nausea. Denies any other symptoms. Does the patient have any new or worsening symptoms? ---Yes Will a triage be completed? ---Yes Related visit to physician within the last 2 weeks? ---Yes Does the PT have any chronic conditions? (i.e. diabetes, asthma, etc.) ---Yes List chronic conditions. ---heart murmur Is this a behavioral health or substance abuse call? ---No Guidelines Guideline Title Affirmed Question Affirmed Notes Immunization Reactions [1] Pain, tenderness, or swelling at the injection site AND [2] persists > 3 days Final Disposition User See PCP When Office is Open (within 3 days) Emilio Math, RN, Estill Bamberg Comments patient reports also experiencing diarrha (5-6 times today). Denies need to triage these symptoms at this time. Just concerned regarding injection site. Referrals REFERRED TO PCP OFFICE REFERRED TO PCP OFFICE Disagree/Comply: Comply

## 2016-11-10 NOTE — Telephone Encounter (Signed)
Pt has appt with Dr Deborra Medina on 11/11/16 at 9 AM.

## 2016-11-11 ENCOUNTER — Ambulatory Visit (INDEPENDENT_AMBULATORY_CARE_PROVIDER_SITE_OTHER): Payer: BC Managed Care – PPO | Admitting: Family Medicine

## 2016-11-11 ENCOUNTER — Encounter: Payer: Self-pay | Admitting: Family Medicine

## 2016-11-11 DIAGNOSIS — T50A15A Adverse effect of pertussis vaccine, including combinations with a pertussis component, initial encounter: Secondary | ICD-10-CM | POA: Insufficient documentation

## 2016-11-11 NOTE — Progress Notes (Signed)
Subjective:   Patient ID: Kyle Johnson, male    DOB: 31-Oct-1970, 46 y.o.   MRN: [id number]  Kyle Johnson is a pleasant 46 y.o. year old male who presents to clinic today with Arm Pain (Pt stated have tetanus shot and arm start having redness, hot to touch)  on 11/11/2016  HPI:  Tetanus shot received here on 11/06/16 ( 5 days ago).  The day after receiving the Tdap immunization here, noticed a painful, red knot at the injection site.  Feels it is getting worse.  Had some nausea last night.  Area is now also red and warm to touch.  Symptoms are getting better.  Less red, less painful but it is itchy.  Has not taken anything for it. Current Outpatient Prescriptions on File Prior to Visit  Medication Sig Dispense Refill  . amLODipine (NORVASC) 5 MG tablet Take 1 tablet (5 mg total) by mouth daily. 90 tablet 1   No current facility-administered medications on file prior to visit.     Allergies  Allergen Reactions  . Aspirin     REACTION: Nausea and vomiting  . Ibuprofen Other (See Comments)    Pt reports he does not remember why he can not take   . Milk-Related Compounds     REACTION: GI  . Penicillins     REACTION: Nausea and vomiting  . Propoxyphene Hcl     REACTION: Rash  . Tetanus Toxoids     Past Medical History:  Diagnosis Date  . Hypertension     No past surgical history on file.  Family History  Problem Relation Age of Onset  . Hypertension Mother   . Stroke Mother   . Heart disease Maternal Grandfather   . Dementia Maternal Grandfather     Social History   Social History  . Marital status: Married    Spouse name: N/A  . Number of children: N/A  . Years of education: N/A   Occupational History  . Not on file.   Social History Main Topics  . Smoking status: Never Smoker  . Smokeless tobacco: Never Used  . Alcohol use No  . Drug use: No  . Sexual activity: Not on file   Other Topics Concern  . Not on file   Social History  Narrative  . No narrative on file   The PMH, PSH, Social History, Family History, Medications, and allergies have been reviewed in [facility], and have been updated if relevant.   Review of Systems  Constitutional: Negative.   HENT: Negative.   Respiratory: Negative.   Musculoskeletal: Positive for myalgias.  Skin: Positive for rash.  Neurological: Negative.   Hematological: Negative.   Psychiatric/Behavioral: Negative.   All other systems reviewed and are negative.      Objective:    BP (!) 142/84   Pulse 87   Temp 98.1 F (36.7 C)   Ht 5\' 9"  (1.753 m)   Wt 191 lb (86.6 kg)   SpO2 99%   BMI 28.21 kg/m    Physical Exam  Constitutional: He is oriented to person, place, and time. He appears well-developed and well-nourished. No distress.  HENT:  Head: Normocephalic and atraumatic.  Eyes: Conjunctivae are normal.  Cardiovascular: Normal rate.   Pulmonary/Chest: Effort normal.  Musculoskeletal: Normal range of motion.  Neurological: He is alert and oriented to person, place, and time. No cranial nerve deficit.  Skin: He is not diaphoretic.     Psychiatric: He has a normal mood and  affect. His behavior is normal. Judgment and thought content normal.  Nursing note and vitals reviewed.         Assessment & Plan:   No diagnosis found. No Follow-up on file.

## 2016-11-11 NOTE — Patient Instructions (Signed)
Great to see you.  Take Benadryl or zyrtec for next few days to help with itching and inflammation.  Please keep [place] updated.

## 2016-11-11 NOTE — Assessment & Plan Note (Signed)
Improving. Per pt, erythema and warmth "covered half" his arm. Localized reaction but itchiness and extensiveness of reaction is concerning for an allergic reaction. Will add tdap to allergy list. Advised as needed antihistamines. Call or return to clinic prn if these symptoms worsen or fail to improve as anticipated. The patient indicates understanding of these issues and agrees with the plan.

## 2017-04-18 ENCOUNTER — Other Ambulatory Visit: Payer: Self-pay | Admitting: Family Medicine

## 2017-04-28 ENCOUNTER — Emergency Department
Admission: EM | Admit: 2017-04-28 | Discharge: 2017-04-28 | Disposition: A | Discharge status: Home / Self Care | Payer: BC Managed Care – PPO | Attending: Emergency Medicine | Admitting: Emergency Medicine

## 2017-04-28 ENCOUNTER — Telehealth: Payer: Self-pay | Admitting: Family Medicine

## 2017-04-28 ENCOUNTER — Encounter: Payer: Self-pay | Admitting: *Deleted

## 2017-04-28 DIAGNOSIS — R112 Nausea with vomiting, unspecified: Secondary | ICD-10-CM | POA: Diagnosis not present

## 2017-04-28 DIAGNOSIS — R197 Diarrhea, unspecified: Secondary | ICD-10-CM | POA: Diagnosis not present

## 2017-04-28 DIAGNOSIS — I10 Essential (primary) hypertension: Secondary | ICD-10-CM | POA: Diagnosis not present

## 2017-04-28 DIAGNOSIS — Z79899 Other long term (current) drug therapy: Secondary | ICD-10-CM | POA: Diagnosis not present

## 2017-04-28 LAB — CBC
HCT: 48.5 % (ref 40.0–52.0)
HEMOGLOBIN: 16.7 g/dL (ref 13.0–18.0)
MCH: 28.6 pg (ref 26.0–34.0)
MCHC: 34.5 g/dL (ref 32.0–36.0)
MCV: 83 fL (ref 80.0–100.0)
PLATELETS: 219 10*3/uL (ref 150–440)
RBC: 5.84 MIL/uL (ref 4.40–5.90)
RDW: 13 % (ref 11.5–14.5)
WBC: 15.8 10*3/uL — AB (ref 3.8–10.6)

## 2017-04-28 LAB — COMPREHENSIVE METABOLIC PANEL
ALBUMIN: 4.5 g/dL (ref 3.5–5.0)
ALK PHOS: 82 U/L (ref 38–126)
ALT: 25 U/L (ref 17–63)
ANION GAP: 8 (ref 5–15)
AST: 23 U/L (ref 15–41)
BUN: 19 mg/dL (ref 6–20)
CALCIUM: 9.4 mg/dL (ref 8.9–10.3)
CO2: 26 mmol/L (ref 22–32)
Chloride: 105 mmol/L (ref 101–111)
Creatinine, Ser: 1.05 mg/dL (ref 0.61–1.24)
GFR calc Af Amer: >60 mL/min (ref >60)
GFR calc non Af Amer: >60 mL/min (ref >60)
Glucose, Bld: 121 mg/dL — ABNORMAL HIGH (ref 65–99)
Potassium: 4.1 mmol/L (ref 3.5–5.1)
SODIUM: 139 mmol/L (ref 135–145)
Total Bilirubin: 1 mg/dL (ref 0.3–1.2)
Total Protein: 7.2 g/dL (ref 6.5–8.1)

## 2017-04-28 LAB — LIPASE, BLOOD: Lipase: 24 U/L (ref 11–51)

## 2017-04-28 MED ORDER — PANTOPRAZOLE SODIUM 40 MG IV SOLR
40.0000 mg | Freq: Once | INTRAVENOUS | Status: AC
  Administered 2017-04-28: 40 mg via INTRAVENOUS
  Filled 2017-04-28: qty 40

## 2017-04-28 MED ORDER — ONDANSETRON 4 MG PO TBDP: ORAL_TABLET | ORAL | 0 refills | Status: DC

## 2017-04-28 MED ORDER — SODIUM CHLORIDE 0.9 % IV BOLUS (SEPSIS)
1000.0000 mL | INTRAVENOUS | Status: AC
  Administered 2017-04-28: 1000 mL via INTRAVENOUS

## 2017-04-28 MED ORDER — ONDANSETRON HCL 4 MG/2ML IJ SOLN
4.0000 mg | INTRAMUSCULAR | Status: AC
  Administered 2017-04-28: 4 mg via INTRAVENOUS
  Filled 2017-04-28: qty 2

## 2017-04-28 NOTE — Discharge Instructions (Signed)

## 2017-04-28 NOTE — ED Triage Notes (Signed)
States this AM at 0600 he began vomiting, states he has vomited 10 times, states feeling tired from the vomiting, at present denies any pain, awake and alert in no acute distress

## 2017-04-28 NOTE — Telephone Encounter (Signed)
Patient Name: Kyle Johnson DOB: May 06, 1971 Initial Comment Caller states her husband has been vomiting since this morning, diarrhea and weak. Nurse Assessment Nurse: Joline Salt, RN, Malachy Mood Date/Time Eilene Ghazi Time): 04/28/2017 12:16:51 PM Confirm and document reason for call. If symptomatic, describe symptoms. ---Caller states her husband has been vomiting x3 since this morning, diarrhea and weak. Not sure if he has a fever. He is drinking water. Does the patient have any new or worsening symptoms? ---Yes Will a triage be completed? ---Yes Related visit to physician within the last 2 weeks? ---No Does the PT have any chronic conditions? (i.e. diabetes, asthma, etc.) ---No Is this a behavioral health or substance abuse call? ---No Guidelines Guideline Title Affirmed Question Affirmed Notes Vomiting MILD vomiting with diarrhea Final Disposition User [place], RN, [place] Disagree/Comply: Comply

## 2017-04-28 NOTE — Telephone Encounter (Signed)
PLEASE NOTE: All timestamps contained within this report are represented as [place] Standard Time. CONFIDENTIALTY NOTICE: This fax transmission is intended only for the addressee. It contains information that is legally privileged, confidential or otherwise protected from use or disclosure. If you are not the intended recipient, you are strictly prohibited from reviewing, disclosing, copying using or disseminating any of this information or taking any action in reliance on or regarding this information. If you have received this fax in error, please notify us immediately by telephone so that we can arrange for its return to [place]. Phone: [phone], Toll-Free: [phone], Fax: [phone] Page: 1 of 1 Call Id: [id number] [place] Patient Name: Kyle Johnson Gender: Male DOB: Jul 30, 1971 Age: 46 Y 104 M 17 D Return Phone Number: [phone] (Primary) City/State/Zip: [place] [address] Client [facility] - Client Client Site [place] - Day Physician Eliezer Lofts - MD Who Is Calling Patient / Member / Family / Caregiver Call Type Triage / Clinical Caller Name Sharyn Lull Relationship To Patient Spouse Return Phone Number [phone] (Primary) Chief Complaint Vomiting Reason for Call Symptomatic / Request for [place] states her husband has been vomiting since this morning, diarrhea and weak. Appointment Disposition EMR Appointment Not Necessary Info pasted into Epic Yes Nurse Assessment Nurse: Joline Salt, RN, Malachy Mood Date/Time Eilene Ghazi Time): 04/28/2017 12:16:51 PM Confirm and document reason for call. If symptomatic, describe symptoms. ---Caller states her husband has been vomiting x3 since this morning, diarrhea and weak. Not sure if he has a fever. He is drinking water. Does the PT have any chronic conditions? (i.e.  diabetes, asthma, etc.) ---No Guidelines Guideline Title Affirmed Question Vomiting MILD vomiting with diarrhea Disp. Time Eilene Ghazi Time) Disposition Final User 04/28/2017 12:27:45 [place], RN, [facility] Advice Given Per Guideline REASSURANCE AND EDUCATION: Vomiting and diarrhea are often caused by viral gastroenteritis (stomach flu) or mild food poisoning. Staying well-hydrated is the most important thing. CLEAR LIQUIDS: Try to sip small amounts (1 tablespoon or 15 ml) of liquid frequently (every 5 minutes) for 8 hours, rather than trying to drink a lot of liquid all at one time. * Sip water or a 1/2 strength sports drink (e.g., Gatorade or Powerade). * Other options: 1/2 strength flat lemon-lime soda or ginger ale. * After 4 hours without vomiting, increase the amount. SOLID FOOD: * You may begin eating bland foods after 8 hours without vomiting. * Start with saltine crackers, white bread, rice, mashed potatoes, cereal, applesauce, etc. * You can resume a normal diet in 24-48 hours. * Avoid NSAIDs, which can cause gastritis CALL BACK IF: * Vomiting lasts more than 2 days (48 hours) * Vomit contains bile (green color) * Diarrhea lasts more than 7 days * Constant stomach pain lasting more than 2 hours * Signs of dehydration (e.g., no urination over 12 hours, very lightheaded) * You become worse. CARE ADVICE per Vomiting (Adult) guideline.

## 2017-04-28 NOTE — ED Notes (Signed)
Pt provided ginger ale. Informed to sip and not chug.  States he is feeling better, HA is less now. Sitting on side of bed with blanket around shoulders.

## 2017-04-28 NOTE — ED Provider Notes (Signed)
[facility] Emergency Department Provider Note  ____________________________________________   First MD Initiated Contact with Patient 04/28/17 1507     (approximate)  I have reviewed the triage vital signs and the nursing notes.   HISTORY  Chief Complaint Emesis    HPI Kyle Johnson is a 46 y.o. male With relatively minimal chronic medical issues except for mild hypertension who presents for evaluation of persistent vomiting starting early this morning.  He states that he and his family ate the same meal last night but shortly afterwards he started feeling a lot of burning epigastric pain that felt like acid reflux.  It went on all night and then he started vomiting around 6 AM and has vomited at least 10 times.  He feels better now and has not vomited for least a couple of hours but he feels worn out and tired.  He aches all over from the vomiting but states that he did not have pain before all the episodes of emesis.  He has also been having multiple episodes of loose stools.  No one else in the family has been ill.  He feels a little bit weak and lightheaded but denies any other symptoms specifically including fever/chills, chest pain, shortness of breath`, and lower abdominal pain.  His also not had any dysuria.  he has not tried to eat recently but  Nothing in particular makes the patient's symptoms better nor worse.  does not drink alcohol.    Past Medical History:  Diagnosis Date  . Hypertension     Patient Active Problem List   Diagnosis Date Noted  . Reaction to DTaP immunization 11/11/2016  . Right-sided chest wall pain 11/06/2016  . Allergic rhinitis 11/24/2012  . PURE HYPERCHOLESTEROLEMIA 12/14/2008  . Essential hypertension 11/18/2007  . HEART MURMUR, HX OF 11/18/2007    History reviewed. No pertinent surgical history.  Prior to Admission medications   Medication Sig Start Date End Date Taking? Authorizing Provider  amLODipine  (NORVASC) 5 MG tablet TAKE 1 TABLET (5 MG TOTAL) BY MOUTH DAILY. 04/19/17   Jinny Sanders, MD  ondansetron (ZOFRAN ODT) 4 MG disintegrating tablet Allow 1-2 tablets to dissolve in your mouth every 8 hours as needed for nausea/vomiting 04/28/17   Hinda Kehr, MD    Allergies Aspirin; Ibuprofen; Milk-related compounds; Penicillins; Propoxyphene hcl; and Tetanus toxoids  Family History  Problem Relation Age of Onset  . Hypertension Mother   . Stroke Mother   . Heart disease Maternal Grandfather   . Dementia Maternal Grandfather     Social History Social History  Substance Use Topics  . Smoking status: Never Smoker  . Smokeless tobacco: Never Used  . Alcohol use No    Review of Systems Constitutional: No fever/chills Eyes: No visual changes. ENT: No sore throat. Cardiovascular: Denies chest pain. Respiratory: Denies shortness of breath. Gastrointestinal: epigastric burning pain with numerous episodes of vomiting since last night and several episodes of diarrhea Genitourinary: Negative for dysuria. Musculoskeletal: Negative for neck pain.  Negative for back pain. Integumentary: Negative for rash. Neurological: Negative for headaches, focal weakness or numbness.   ____________________________________________   PHYSICAL EXAM:  VITAL SIGNS: ED Triage Vitals  Enc Vitals Group     BP 04/28/17 1344 (!) 148/91     Pulse Rate 04/28/17 1344 (!) 112     Resp 04/28/17 1344 16     Temp 04/28/17 1344 98 F (36.7 C)     Temp Source 04/28/17 1344 Oral  SpO2 04/28/17 1344 98 %     Weight 04/28/17 1345 86.2 kg (190 lb)     Height 04/28/17 1345 1.753 m (5\' 9" )     Head Circumference --      Peak Flow --      Pain Score 04/28/17 1346 0     Pain Loc --      Pain Edu? --      Excl. in [place]? --     Constitutional: Alert and oriented. Well appearing and in no acute distress. Eyes: Conjunctivae are normal.  Head: Atraumatic. Nose: No congestion/rhinnorhea. Mouth/Throat: Mucous  membranes are moist. Neck: No stridor.  No meningeal signs.   Cardiovascular: Normal rate, regular rhythm. Good peripheral circulation. Grossly normal heart sounds. Respiratory: Normal respiratory effort.  No retractions. Lungs CTAB. Gastrointestinal: Soft and nontender, negative Murphy sign, no tenderness at McBurney's point. No distention.  Musculoskeletal: No lower extremity tenderness nor edema. No gross deformities of extremities. Neurologic:  Normal speech and language. No gross focal neurologic deficits are appreciated.  Skin:  Skin is warm, dry and intact. No rash noted. Psychiatric: Mood and affect are normal. Speech and behavior are normal.  ____________________________________________   LABS (all labs ordered are listed, but only abnormal results are displayed)  Labs Reviewed  COMPREHENSIVE METABOLIC PANEL - Abnormal; Notable for the following:       Result Value   Glucose, Bld 121 (*)    All other components within normal limits  CBC - Abnormal; Notable for the following:    WBC 15.8 (*)    All other components within normal limits  LIPASE, BLOOD  URINALYSIS, COMPLETE (UACMP) WITH MICROSCOPIC   ____________________________________________  EKG  None - EKG not ordered by ED physician ____________________________________________  RADIOLOGY   No results found.  ____________________________________________   PROCEDURES  Critical Care performed: No   Procedure(s) performed:   Procedures   ____________________________________________   INITIAL IMPRESSION / ASSESSMENT AND PLAN / ED COURSE  Pertinent labs & imaging results that were available during my care of the patient were reviewed by me and considered in my medical decision making (see chart for details).  The patient most likely is suffering from viral gastroenteritis although foodborne pathogen is also possible.  He looks slightly volume depleted and he is mildly tachycardic even though he is  feeling much better.  I will treat him with pantoprazole 40 mg IV, 1 L of fluids, and some more Zofran and we will try a PO challenge.  He has no tenderness to palpation of his abdomen which is reassuring and I do not feel he would benefit from imaging.  The patient is comfortable with the plan to go home after symptomatic treatment and he has not vomited for several hours.  I had my usual and customary recommendation and return precautions discussion with him.   Clinical Course as of Apr 28 1944  Wed Apr 28, 2017  1851 patient is feeling better, still having some diarrhea but in general is improved.  He was able to walk to and from the bathroom without much difficulty.  His abdomen is still sore, and I gave him my usual customary return precautions, but I strongly doubt this is a developing intra-abdominal infection such as appendicitis as opposed to a viral gastroenteritis or food poisoning as described above.  He understands and he has wife agree with the plan.  [CF]    Clinical Course User Index [CF] Hinda Kehr, MD    ____________________________________________  FINAL CLINICAL  IMPRESSION(S) / ED DIAGNOSES  Final diagnoses:  Nausea vomiting and diarrhea     MEDICATIONS GIVEN DURING THIS VISIT:  Medications  pantoprazole (PROTONIX) injection 40 mg (40 mg Intravenous Given 04/28/17 1608)  sodium chloride 0.9 % bolus 1,000 mL (0 mLs Intravenous Stopped 04/28/17 1805)  ondansetron (ZOFRAN) injection 4 mg (4 mg Intravenous Given 04/28/17 1606)     NEW OUTPATIENT MEDICATIONS STARTED DURING THIS VISIT:  Discharge Medication List as of 04/28/2017  6:53 PM    START taking these medications   Details  ondansetron (ZOFRAN ODT) 4 MG disintegrating tablet Allow 1-2 tablets to dissolve in your mouth every 8 hours as needed for nausea/vomiting, Print        Discharge Medication List as of 04/28/2017  6:53 PM      Discharge Medication List as of 04/28/2017  6:53 PM       Note:  This  document was prepared using Dragon voice recognition software and may include unintentional dictation errors.    Hinda Kehr, MD 04/28/17 1945

## 2017-04-28 NOTE — ED Notes (Signed)
Pt resting on stretcher, lights dimmed for comfort. Blankets covering pt.

## 2017-04-28 NOTE — ED Notes (Signed)
Pt refused a wheelchair to lobby and was able to ambulate. Pt reports feeling improvement and feeling safe to go home. D/c papers reviewed and pt verbalized understanding.

## 2017-05-06 ENCOUNTER — Encounter: Payer: Self-pay | Admitting: Family Medicine

## 2017-05-06 ENCOUNTER — Telehealth: Payer: Self-pay

## 2017-05-06 ENCOUNTER — Ambulatory Visit (INDEPENDENT_AMBULATORY_CARE_PROVIDER_SITE_OTHER): Payer: BC Managed Care – PPO | Admitting: Family Medicine

## 2017-05-06 DIAGNOSIS — A084 Viral intestinal infection, unspecified: Secondary | ICD-10-CM

## 2017-05-06 DIAGNOSIS — K219 Gastro-esophageal reflux disease without esophagitis: Secondary | ICD-10-CM

## 2017-05-06 MED ORDER — OMEPRAZOLE 20 MG PO CPDR: 20.0000 mg | DELAYED_RELEASE_CAPSULE | Freq: Every day | ORAL | 3 refills | Status: DC

## 2017-05-06 NOTE — Patient Instructions (Signed)
Work  on low fat (less greasy, fatty foods, no fried foods), low acid diet (no tomatos, no citris, no caffeine).  Can try prilosec 20 mg daily OTC  To decrease acid in stomach.  Call if not improving and if fever or new severe abdominal pain occurs.

## 2017-05-06 NOTE — Progress Notes (Signed)
   Subjective:    Patient ID: Kyle Johnson, male    DOB: March 31, 1971, 46 y.o.   MRN: [id number]  HPI   46 year old  Male presents for follow up on ED  On 04/28/17.   Summary copied below:  Kyle Johnson is a 46 y.o. male With relatively minimal chronic medical issues except for mild hypertension who presents for evaluation of persistent vomiting starting early this morning.  He states that he and his family ate the same meal last night but shortly afterwards he started feeling a lot of burning epigastric pain that felt like acid reflux.  It went on all night and then he started vomiting around 6 AM and has vomited at least 10 times.  He feels better now and has not vomited for least a couple of hours but he feels worn out and tired.  He aches all over from the vomiting but states that he did not have pain before all the episodes of emesis.  He has also been having multiple episodes of loose stools.  No one else in the family has been ill.  He feels a little bit weak and lightheaded but denies any other symptoms specifically including fever/chills, chest pain, shortness of breath`, and lower abdominal pain.  His also not had any dysuria.  he has not tried to eat recently but  Nothing in particular makes the patient's symptoms better nor worse.  does not drink alcohol  Dx with viral GE Labs nml except wbc 15 Nml lipase UA nml  Today he reports he felt much better..  emesis and diarrhea had resolved This AM he had sour burp, later watery stools, nausea.  Occ pressure in chest .. Feels like gas.  Has no eating anything.  No fever. No abdominal pain.  Last night 2 hot dogs for dinner. [employer]  For lunch.  Review of Systems  Constitutional: Negative for fatigue and fever.  HENT: Negative for ear pain.   Eyes: Negative for pain.  Respiratory: Negative for cough and shortness of breath.   Cardiovascular: Negative for chest pain, palpitations and leg swelling.  Gastrointestinal: Positive  for nausea and vomiting. Negative for abdominal pain.  Genitourinary: Negative for dysuria.  Musculoskeletal: Negative for arthralgias.  Neurological: Negative for syncope, light-headedness and headaches.  Psychiatric/Behavioral: Negative for dysphoric mood.       Objective:   Physical Exam  Constitutional: Vital signs are normal. He appears well-developed and well-nourished.  HENT:  Head: Normocephalic.  Right Ear: Hearing normal.  Left Ear: Hearing normal.  Nose: Nose normal.  Mouth/Throat: Oropharynx is clear and moist and mucous membranes are normal.  Neck: Trachea normal. Carotid bruit is not present. No thyroid mass and no thyromegaly present.  Cardiovascular: Normal rate, regular rhythm and normal pulses.  Exam reveals no gallop, no distant heart sounds and no friction rub.   No murmur heard. No peripheral edema  Pulmonary/Chest: Effort normal and breath sounds normal. No respiratory distress.  Skin: Skin is warm, dry and intact. No rash noted.  Psychiatric: He has a normal mood and affect. His speech is normal and behavior is normal. Thought content normal.          Assessment & Plan:

## 2017-05-06 NOTE — Telephone Encounter (Signed)
Sharyn Lull (no signed DPR) left v/m requesting prilosec to be sent to [facility] to use Flex spending money instead of buying OTC. Unable to reach pt by phone. If can send in rx pts wife will ck with pharmacy.

## 2017-05-06 NOTE — Telephone Encounter (Signed)
Rx for omeprazole sent as requested.

## 2017-05-16 DIAGNOSIS — K219 Gastro-esophageal reflux disease without esophagitis: Secondary | ICD-10-CM | POA: Insufficient documentation

## 2017-05-16 DIAGNOSIS — A084 Viral intestinal infection, unspecified: Secondary | ICD-10-CM | POA: Insufficient documentation

## 2017-05-16 NOTE — Assessment & Plan Note (Addendum)
Push fluids, rest. Symptomatic care.

## 2017-05-16 NOTE — Assessment & Plan Note (Signed)
Trial of PPI.

## 2017-06-18 ENCOUNTER — Encounter: Payer: Self-pay | Admitting: Family Medicine

## 2017-06-18 ENCOUNTER — Ambulatory Visit (INDEPENDENT_AMBULATORY_CARE_PROVIDER_SITE_OTHER): Payer: BC Managed Care – PPO | Admitting: Family Medicine

## 2017-06-18 VITALS — BP 100/72 | HR 112 | Temp 97.4°F | Ht 69.25 in | Wt 173.5 lb

## 2017-06-18 DIAGNOSIS — K219 Gastro-esophageal reflux disease without esophagitis: Secondary | ICD-10-CM | POA: Diagnosis not present

## 2017-06-18 DIAGNOSIS — R1115 Cyclical vomiting syndrome unrelated to migraine: Secondary | ICD-10-CM

## 2017-06-18 DIAGNOSIS — G43A0 Cyclical vomiting, not intractable: Secondary | ICD-10-CM

## 2017-06-18 MED ORDER — PANTOPRAZOLE SODIUM 40 MG PO TBEC: 40.0000 mg | DELAYED_RELEASE_TABLET | Freq: Every day | ORAL | 3 refills | Status: DC

## 2017-06-18 NOTE — Patient Instructions (Signed)
Stop prilosec.  Change to protonix 40 mg daily. Please stop at the front desk to set up referral.

## 2017-06-18 NOTE — Progress Notes (Signed)
   Subjective:    Patient ID: Kyle Johnson, male    DOB: 08/18/1970, 46 y.o.   MRN: [id number]  HPI 46 year old male presents with continued reflux and intermittent    He was seen on 05/06/2017 with viral GE resulting in burping, heartburn and diarrhea Started on PPI and trigger avoidance.  He is currently on prilosec 20 mg daily He started feeling better.Martin Majestic back to baseline.  He still was watching what he ate.   He reports that  earlier this week 2 days ago after eating at [employer].. Several hours later he awoke with  Central pain in chest, sour burp than followed with emesis and diarrhea. Last 10 hours.. Resolves on its own.  No abdominal pain, no blood in stool.  Eating less and losing weight  Because worried about what he can eat. Wt Readings from Last 3 Encounters:  06/18/17 173 lb 8 oz (78.7 kg)  05/06/17 188 lb (85.3 kg)  04/28/17 190 lb (86.2 kg)      No ETOH, avoiding tomatos, citris.  Nml lipsae, LFTS 9/12/in ER.   Review of Systems  Constitutional: Positive for fatigue. Negative for fever.  HENT: Negative for ear pain.   Eyes: Negative for pain.  Respiratory: Negative for cough and shortness of breath.   Cardiovascular: Negative for chest pain, palpitations and leg swelling.  Gastrointestinal: Positive for diarrhea, nausea and vomiting. Negative for abdominal pain and blood in stool.  Genitourinary: Negative for dysuria.  Musculoskeletal: Negative for arthralgias.  Neurological: Negative for syncope, light-headedness and headaches.  Psychiatric/Behavioral: Negative for dysphoric mood.       Objective:   Physical Exam  Constitutional: Vital signs are normal. He appears well-developed and well-nourished.  HENT:  Head: Normocephalic.  Right Ear: Hearing normal.  Left Ear: Hearing normal.  Nose: Nose normal.  Mouth/Throat: Oropharynx is clear and moist and mucous membranes are normal.  Neck: Trachea normal. Carotid bruit is not present. No  thyroid mass and no thyromegaly present.  Cardiovascular: Normal rate, regular rhythm and normal pulses.  Exam reveals no gallop, no distant heart sounds and no friction rub.   No murmur heard. No peripheral edema  Pulmonary/Chest: Effort normal and breath sounds normal. No respiratory distress.  Skin: Skin is warm, dry and intact. No rash noted.  Psychiatric: He has a normal mood and affect. His speech is normal and behavior is normal. Thought content normal.          Assessment & Plan:

## 2017-06-18 NOTE — Assessment & Plan Note (Signed)
Recurrent emesis and what sounds like reflux.  Change prilosec to pantoprazole at higher dose.  Continue to avoid triggers but given recurrent and pt with unexpected weight loss of 15 lbs.. Will refer to GI.

## 2017-06-25 ENCOUNTER — Ambulatory Visit: Payer: BC Managed Care – PPO | Admitting: Gastroenterology

## 2017-07-30 ENCOUNTER — Ambulatory Visit: Payer: BC Managed Care – PPO | Admitting: Gastroenterology

## 2017-07-30 ENCOUNTER — Encounter (INDEPENDENT_AMBULATORY_CARE_PROVIDER_SITE_OTHER): Payer: Self-pay

## 2017-07-30 ENCOUNTER — Other Ambulatory Visit: Payer: Self-pay

## 2017-07-30 ENCOUNTER — Encounter: Payer: Self-pay | Admitting: Gastroenterology

## 2017-07-30 VITALS — BP 147/87 | HR 78 | Ht 69.0 in | Wt 178.8 lb

## 2017-07-30 DIAGNOSIS — K219 Gastro-esophageal reflux disease without esophagitis: Secondary | ICD-10-CM

## 2017-07-30 DIAGNOSIS — Z1211 Encounter for screening for malignant neoplasm of colon: Secondary | ICD-10-CM | POA: Diagnosis not present

## 2017-07-30 NOTE — Progress Notes (Signed)
Kyle Johnson [place], [place] [address]  Main: [phone]  Fax: [phone]   Gastroenterology Consultation  Referring Provider:     Jinny Sanders, MD Primary Care Physician:  Jinny Sanders, MD Primary Gastroenterologist:  Dr. Vonda Johnson Reason for Consultation:     GERD, weight loss        HPI:   Kyle Johnson is a 46 y.o. y/o male referred for consultation & management  by Dr. Diona Browner, Mervyn Gay, MD.  Patient was referred by primary care provider due to uncontrolled GERD with daily PPI.  Patient was initially placed on Prilosec 20 mg daily about 2 months ago and then switched to pantoprazole 40 mg daily due to uncontrolled symptoms about 3 weeks ago.  Symptoms remain uncontrolled.  Patient describes intermittent episodes of sour taste in mouth, belching, followed by diarrhea all in the same episode.  These episodes occur every 1-2 weeks.  No hematemesis, blood in stool or melena.  The patient has had a 20 pound weight loss in the last 2 months.  No family history of colon cancer.  Patient reports history of anxiety.  He reports episodes of diarrhea is watery stools 4-5 times during the episode and then regular soft bowel movements every day after that.  His lab test in September 2018 were normal with no anemia and normal liver enzymes and electrolytes.  He attributes his weight loss to eating less as he does not know what will trigger his symptoms and is afraid of eating anything that will trigger symptoms.  There are not any specific foods that cause the trouble but reports that some of it has to do with his baseline anxiety.  Past Medical History:  Diagnosis Date  . Hypertension     History reviewed. No pertinent surgical history.  Prior to Admission medications   Medication Sig Start Date End Date Taking? Authorizing Provider  amLODipine (NORVASC) 5 MG tablet TAKE 1 TABLET (5 MG TOTAL) BY MOUTH DAILY. 04/19/17   Bedsole, Amy E, MD    pantoprazole (PROTONIX) 40 MG tablet Take 1 tablet (40 mg total) by mouth daily. 06/18/17   Jinny Sanders, MD    Family History  Problem Relation Age of Onset  . Hypertension Mother   . Stroke Mother   . Heart disease Maternal Grandfather   . Dementia Maternal Grandfather      Social History   Tobacco Use  . Smoking status: Never Smoker  . Smokeless tobacco: Never Used  Substance Use Topics  . Alcohol use: No  . Drug use: No    Allergies as of 07/30/2017 - Review Complete 07/30/2017  Allergen Reaction Noted  . Aspirin    . Ibuprofen Other (See Comments) 09/23/2016  . Milk-related compounds    . Penicillins    . Propoxyphene hcl    . Tetanus toxoids  11/11/2016    Review of Systems:    All systems reviewed and negative except where noted in HPI.   Physical Exam:  BP (!) 147/87   Pulse 78   Ht 5\' 9"  (1.753 m)   Wt 178 lb 12.8 oz (81.1 kg)   BMI 26.40 kg/m  No LMP for male patient. Psych:  Alert and cooperative. Normal mood and affect. General:   Alert,  Well-developed, well-nourished, pleasant and cooperative in NAD Head:  Normocephalic and atraumatic. Eyes:  Sclera clear, no icterus.   Conjunctiva pink. Ears:  Normal auditory acuity. Nose:  No  deformity, discharge, or lesions. Mouth:  No deformity or lesions,oropharynx pink & moist. Neck:  Supple; no masses or thyromegaly. Lungs:  Respirations even and unlabored.  Clear throughout to auscultation.   No wheezes, crackles, or rhonchi. No acute distress. Heart:  Regular rate and rhythm; no murmurs, clicks, rubs, or gallops. Abdomen:  Normal bowel sounds.  No bruits.  Soft, non-tender and non-distended without masses, hepatosplenomegaly or hernias noted.  No guarding or rebound tenderness.    Msk:  Symmetrical without gross deformities. Good, equal movement & strength bilaterally. Pulses:  Normal pulses noted. Extremities:  No clubbing or edema.  No cyanosis. Neurologic:  Alert and oriented x3;  grossly normal  neurologically. Skin:  Intact without significant lesions or rashes. No jaundice. Lymph Nodes:  No significant cervical adenopathy. Psych:  Alert and cooperative. Normal mood and affect.   Labs: CBC    Component Value Date/Time   WBC 15.8 (H) 04/28/2017 1342   RBC 5.84 04/28/2017 1342   HGB 16.7 04/28/2017 1342   HGB 16.6 07/13/2013 0041   HCT 48.5 04/28/2017 1342   HCT 48.2 07/13/2013 0041   PLT 219 04/28/2017 1342   PLT 192 07/13/2013 0041   MCV 83.0 04/28/2017 1342   MCV 83 07/13/2013 0041   MCH 28.6 04/28/2017 1342   MCHC 34.5 04/28/2017 1342   RDW 13.0 04/28/2017 1342   RDW 12.9 07/13/2013 0041   MONOABS 0.4 11/18/2007 1056   EOSABS 0.1 11/18/2007 1056   BASOSABS 0.0 11/18/2007 1056   CMP     Component Value Date/Time   NA 139 04/28/2017 1342   NA 138 07/13/2013 0041   K 4.1 04/28/2017 1342   K 3.9 07/13/2013 0041   CL 105 04/28/2017 1342   CL 103 07/13/2013 0041   CO2 26 04/28/2017 1342   CO2 27 07/13/2013 0041   GLUCOSE 121 (H) 04/28/2017 1342   GLUCOSE 142 (H) 07/13/2013 0041   BUN 19 04/28/2017 1342   BUN 15 07/13/2013 0041   CREATININE 1.05 04/28/2017 1342   CREATININE 0.98 07/13/2013 0041   CALCIUM 9.4 04/28/2017 1342   CALCIUM 9.4 07/13/2013 0041   PROT 7.2 04/28/2017 1342   PROT 7.9 07/13/2013 0041   ALBUMIN 4.5 04/28/2017 1342   ALBUMIN 4.4 07/13/2013 0041   AST 23 04/28/2017 1342   AST 27 07/13/2013 0041   ALT 25 04/28/2017 1342   ALT 48 07/13/2013 0041   ALKPHOS 82 04/28/2017 1342   ALKPHOS 95 07/13/2013 0041   BILITOT 1.0 04/28/2017 1342   BILITOT 0.6 07/13/2013 0041   GFRNONAA >60 04/28/2017 1342   GFRNONAA >60 07/13/2013 0041   GFRAA >60 04/28/2017 1342   GFRAA >60 07/13/2013 0041    No results found.  Assessment and Plan:   [name] is a 46 y.o. y/o male has been referred for intermittent episodes of uncontrolled acid reflux, and diarrhea and 20 pound weight loss  Due to uncontrolled acid reflux and weight loss we  will schedule for EGD for evaluation We will continue PPI for now, and can change dose depending on EGD findings Educated on acid reflux lifestyle modifications and handout given for the same Risks of PPI use were discussed with patient including bone loss, C. Diff diarrhea, pneumonia, infections, CKD, electrolyte abnormalities. Pt. Verbalizes understanding and chooses to continue the medication.   Patient is above 15 years old and according to [facility] recent guidelines, colorectal cancer screening is also recommended in this patient.  We will schedule for  colonoscopy for CRC screening and due to 20 pound weight loss and diarrhea.  I have discussed alternative options, risks & benefits,  which include, but are not limited to, bleeding, infection, perforation,respiratory complication & drug reaction.  The patient agrees with this plan & written consent will be obtained.     Dr Kyle Johnson

## 2017-07-30 NOTE — Addendum Note (Signed)
Addended by: Vanetta Mulders on: 07/30/2017 04:29 PM   Modules accepted: Orders, SmartSet

## 2017-08-20 ENCOUNTER — Ambulatory Visit: Payer: BC Managed Care – PPO | Admitting: Anesthesiology

## 2017-08-20 ENCOUNTER — Encounter: Payer: Self-pay | Admitting: *Deleted

## 2017-08-20 ENCOUNTER — Encounter
Admission: RE | Disposition: A | Discharge status: Home / Self Care | Payer: Self-pay | Source: Ambulatory Visit | Attending: Gastroenterology

## 2017-08-20 ENCOUNTER — Ambulatory Visit
Admission: RE | Admit: 2017-08-20 | Discharge: 2017-08-20 | Disposition: A | Discharge status: Home / Self Care | Payer: BC Managed Care – PPO | Source: Ambulatory Visit | Attending: Gastroenterology | Admitting: Gastroenterology

## 2017-08-20 ENCOUNTER — Other Ambulatory Visit: Payer: Self-pay

## 2017-08-20 DIAGNOSIS — Z0000 Encounter for general adult medical examination without abnormal findings: Secondary | ICD-10-CM

## 2017-08-20 DIAGNOSIS — K635 Polyp of colon: Secondary | ICD-10-CM | POA: Insufficient documentation

## 2017-08-20 DIAGNOSIS — Z8249 Family history of ischemic heart disease and other diseases of the circulatory system: Secondary | ICD-10-CM | POA: Insufficient documentation

## 2017-08-20 DIAGNOSIS — K648 Other hemorrhoids: Secondary | ICD-10-CM | POA: Insufficient documentation

## 2017-08-20 DIAGNOSIS — K2950 Unspecified chronic gastritis without bleeding: Secondary | ICD-10-CM | POA: Diagnosis not present

## 2017-08-20 DIAGNOSIS — K3189 Other diseases of stomach and duodenum: Secondary | ICD-10-CM

## 2017-08-20 DIAGNOSIS — Z91011 Allergy to milk products: Secondary | ICD-10-CM | POA: Diagnosis not present

## 2017-08-20 DIAGNOSIS — I10 Essential (primary) hypertension: Secondary | ICD-10-CM | POA: Insufficient documentation

## 2017-08-20 DIAGNOSIS — K529 Noninfective gastroenteritis and colitis, unspecified: Secondary | ICD-10-CM | POA: Insufficient documentation

## 2017-08-20 DIAGNOSIS — Z887 Allergy status to serum and vaccine status: Secondary | ICD-10-CM | POA: Insufficient documentation

## 2017-08-20 DIAGNOSIS — D122 Benign neoplasm of ascending colon: Secondary | ICD-10-CM | POA: Insufficient documentation

## 2017-08-20 DIAGNOSIS — R011 Cardiac murmur, unspecified: Secondary | ICD-10-CM | POA: Diagnosis not present

## 2017-08-20 DIAGNOSIS — Z886 Allergy status to analgesic agent status: Secondary | ICD-10-CM | POA: Diagnosis not present

## 2017-08-20 DIAGNOSIS — Z79899 Other long term (current) drug therapy: Secondary | ICD-10-CM | POA: Diagnosis not present

## 2017-08-20 DIAGNOSIS — K219 Gastro-esophageal reflux disease without esophagitis: Secondary | ICD-10-CM | POA: Insufficient documentation

## 2017-08-20 DIAGNOSIS — Z1211 Encounter for screening for malignant neoplasm of colon: Secondary | ICD-10-CM | POA: Diagnosis not present

## 2017-08-20 DIAGNOSIS — K5730 Diverticulosis of large intestine without perforation or abscess without bleeding: Secondary | ICD-10-CM

## 2017-08-20 DIAGNOSIS — Z880 Allergy status to penicillin: Secondary | ICD-10-CM | POA: Diagnosis not present

## 2017-08-20 DIAGNOSIS — Z888 Allergy status to other drugs, medicaments and biological substances status: Secondary | ICD-10-CM | POA: Insufficient documentation

## 2017-08-20 DIAGNOSIS — R12 Heartburn: Secondary | ICD-10-CM | POA: Diagnosis not present

## 2017-08-20 HISTORY — PX: ESOPHAGOGASTRODUODENOSCOPY (EGD) WITH PROPOFOL: SHX5813

## 2017-08-20 HISTORY — DX: Gastro-esophageal reflux disease without esophagitis: K21.9

## 2017-08-20 HISTORY — DX: Cardiac murmur, unspecified: R01.1

## 2017-08-20 HISTORY — PX: COLONOSCOPY WITH PROPOFOL: SHX5780

## 2017-08-20 LAB — HM COLONOSCOPY

## 2017-08-20 SURGERY — COLONOSCOPY WITH PROPOFOL
Anesthesia: General

## 2017-08-20 MED ORDER — LIDOCAINE HCL (PF) 2 % IJ SOLN
INTRAMUSCULAR | Status: AC
  Filled 2017-08-20: qty 10

## 2017-08-20 MED ORDER — FENTANYL CITRATE (PF) 100 MCG/2ML IJ SOLN
INTRAMUSCULAR | Status: AC
  Filled 2017-08-20: qty 2

## 2017-08-20 MED ORDER — EPHEDRINE SULFATE 50 MG/ML IJ SOLN
INTRAMUSCULAR | Status: DC | PRN
  Administered 2017-08-20: 5 mg via INTRAVENOUS

## 2017-08-20 MED ORDER — PROPOFOL 500 MG/50ML IV EMUL
INTRAVENOUS | Status: DC | PRN
  Administered 2017-08-20: 120 ug/kg/min via INTRAVENOUS

## 2017-08-20 MED ORDER — EPHEDRINE SULFATE 50 MG/ML IJ SOLN
INTRAMUSCULAR | Status: AC
  Filled 2017-08-20: qty 1

## 2017-08-20 MED ORDER — PHENYLEPHRINE HCL 10 MG/ML IJ SOLN
INTRAMUSCULAR | Status: DC | PRN
  Administered 2017-08-20: 50 ug via INTRAVENOUS
  Administered 2017-08-20: 100 ug via INTRAVENOUS
  Administered 2017-08-20 (×2): 50 ug via INTRAVENOUS

## 2017-08-20 MED ORDER — PHENYLEPHRINE HCL 10 MG/ML IJ SOLN
INTRAMUSCULAR | Status: AC
  Filled 2017-08-20: qty 1

## 2017-08-20 MED ORDER — LIDOCAINE HCL (CARDIAC) 20 MG/ML IV SOLN
INTRAVENOUS | Status: DC | PRN
  Administered 2017-08-20: 30 mg via INTRAVENOUS

## 2017-08-20 MED ORDER — FENTANYL CITRATE (PF) 100 MCG/2ML IJ SOLN
INTRAMUSCULAR | Status: DC | PRN
  Administered 2017-08-20: 50 ug via INTRAVENOUS

## 2017-08-20 MED ORDER — MIDAZOLAM HCL 2 MG/2ML IJ SOLN
INTRAMUSCULAR | Status: DC | PRN
  Administered 2017-08-20: 2 mg via INTRAVENOUS

## 2017-08-20 MED ORDER — PROPOFOL 500 MG/50ML IV EMUL
INTRAVENOUS | Status: AC
  Filled 2017-08-20: qty 50

## 2017-08-20 MED ORDER — MIDAZOLAM HCL 2 MG/2ML IJ SOLN
INTRAMUSCULAR | Status: AC
  Filled 2017-08-20: qty 2

## 2017-08-20 MED ORDER — SODIUM CHLORIDE 0.9 % IV SOLN
INTRAVENOUS | Status: DC
  Administered 2017-08-20: 11:00:00 via INTRAVENOUS

## 2017-08-20 NOTE — Anesthesia Postprocedure Evaluation (Signed)
Anesthesia Post Note  Patient: Kyle Johnson  Procedure(s) Performed: COLONOSCOPY WITH PROPOFOL (N/A ) ESOPHAGOGASTRODUODENOSCOPY (EGD) WITH PROPOFOL (N/A )  Patient location during evaluation: Endoscopy Anesthesia Type: General Level of consciousness: awake and alert Pain management: pain level controlled Vital Signs Assessment: post-procedure vital signs reviewed and stable Respiratory status: spontaneous breathing and respiratory function stable Cardiovascular status: stable Anesthetic complications: no     Last Vitals:  Vitals:   08/20/17 1215 08/20/17 1225  BP: (!) 100/51 (!) 102/58  Pulse: 64 67  Resp: 15 17  Temp:    SpO2: 100% 100%    Last Pain:  Vitals:   08/20/17 1155  TempSrc: Tympanic                 KEPHART,WILLIAM K

## 2017-08-20 NOTE — Anesthesia Procedure Notes (Signed)
Performed by: Cook-Martin, Harvin Konicek Pre-anesthesia Checklist: Patient identified, Emergency Drugs available, Suction available, Patient being monitored and Timeout performed Patient Re-evaluated:Patient Re-evaluated prior to induction Oxygen Delivery Method: Nasal cannula Preoxygenation: Pre-oxygenation with 100% oxygen Induction Type: IV induction Airway Equipment and Method: Bite block Placement Confirmation: CO2 detector and positive ETCO2       

## 2017-08-20 NOTE — Op Note (Signed)
[facility] Gastroenterology Patient Name: Kyle Johnson Procedure Date: 08/20/2017 11:02 AM MRN: [id number] Account #: [id number] Date of Birth: 15-Jun-1971 Admit Type: Outpatient Age: 47 Room: [facility] ENDO ROOM 1 Gender: Male Note Status: Finalized Procedure:            Upper GI endoscopy Indications:          Heartburn, Suspected esophageal reflux Providers:            Lenora Gomes B. Bonna Gains MD, MD Referring MD:         Jinny Sanders MD, MD (Referring MD) Medicines:            Monitored Anesthesia Care Complications:        No immediate complications. Procedure:            Pre-Anesthesia Assessment:                       - The risks and benefits of the procedure and the                        sedation options and risks were discussed with the                        patient. All questions were answered and informed                        consent was obtained.                       - Patient identification and proposed procedure were                        verified prior to the procedure.                       - ASA Grade Assessment: II - A patient with mild                        systemic disease.                       After obtaining informed consent, the endoscope was                        passed under direct vision. Throughout the procedure,                        the patient's blood pressure, pulse, and oxygen                        saturations were monitored continuously. The Endoscope                        was introduced through the mouth, and advanced to the                        second part of duodenum. The upper GI endoscopy was                        accomplished with ease. The patient tolerated the  procedure well. Findings:      The examined esophagus was normal.      The Z-line was regular.      Patchy mildly erythematous mucosa was found in the gastric antrum.       Biopsies were taken with a cold forceps for histology.    The duodenal bulb and second portion of the duodenum were normal. Impression:           - Normal esophagus.                       - Z-line regular.                       - Erythematous mucosa in the antrum. Biopsied.                       - Normal duodenal bulb and second portion of the                        duodenum. Recommendation:       - Await pathology results.                       - Resume previous diet.                       - Continue present medications.                       - Discharge patient to home.                       - Follow an antireflux regimen.                       - The findings and recommendations were discussed with                        the patient.                       - Return to my office in 4 weeks. Procedure Code(s):    --- Professional ---                       [phone], Esophagogastroduodenoscopy, flexible, transoral;                        with biopsy, single or multiple Diagnosis Code(s):    --- Professional ---                       K31.89, Other diseases of stomach and duodenum                       R12, Heartburn CPT copyright 2016 American Medical Association. All rights reserved. The codes documented in this report are preliminary and upon coder review may  be revised to meet current compliance requirements.  [name], MD Margretta Sidle B. Bonna Gains MD, MD 08/20/2017 11:19:59 AM This report has been signed electronically. Number of Addenda: 0 Note Initiated On: 08/20/2017 11:02 AM      [facility]

## 2017-08-20 NOTE — Transfer of Care (Signed)
Immediate Anesthesia Transfer of Care Note  Patient: Kyle Johnson  Procedure(s) Performed: COLONOSCOPY WITH PROPOFOL (N/A ) ESOPHAGOGASTRODUODENOSCOPY (EGD) WITH PROPOFOL (N/A )  Patient Location: PACU  Anesthesia Type:General  Level of Consciousness: awake, alert  and oriented  Airway & Oxygen Therapy: Patient Spontanous Breathing and Patient connected to nasal cannula oxygen  Post-op Assessment: Report given to RN and Post -op Vital signs reviewed and stable  Post vital signs: Reviewed and stable  Last Vitals:  Vitals:   08/20/17 1155 08/20/17 1156  BP: (!) 96/45 (!) 96/45  Pulse: 68 69  Resp: 18 16  Temp: (!) 36.1 C (!) 36.1 C  SpO2: 99% 99%    Last Pain:  Vitals:   08/20/17 1155  TempSrc: Tympanic         Complications: No apparent anesthesia complications

## 2017-08-20 NOTE — Op Note (Signed)
[facility] Gastroenterology Patient Name: Kyle Johnson Procedure Date: 08/20/2017 11:01 AM MRN: [id number] Account #: [id number] Date of Birth: 28-Nov-1970 Admit Type: Outpatient Age: 47 Room: [facility] ENDO ROOM 1 Gender: Male Note Status: Finalized Procedure:            Colonoscopy Indications:          Screening for colorectal malignant neoplasm, Incidental                        - Clinically significant diarrhea of unexplained origin Providers:            Jairus Tonne B. Bonna Gains MD, MD Referring MD:         Jinny Sanders MD, MD (Referring MD) Medicines:            Monitored Anesthesia Care Complications:        No immediate complications. Procedure:            Pre-Anesthesia Assessment:                       - The risks and benefits of the procedure and the                        sedation options and risks were discussed with the                        patient. All questions were answered and informed                        consent was obtained.                       - Patient identification and proposed procedure were                        verified prior to the procedure by the physician, the                        nurse, the anesthetist and the technician. The                        procedure was verified in the pre-procedure area in the                        procedure room in the endoscopy suite.                       - ASA Grade Assessment: II - A patient with mild                        systemic disease.                       After obtaining informed consent, the colonoscope was                        passed under direct vision. Throughout the procedure,                        the patient's blood pressure, pulse, and oxygen  saturations were monitored continuously. The                        Colonoscope was introduced through the anus and                        advanced to the the terminal ileum. The colonoscopy was       performed with ease. The patient tolerated the                        procedure well. The quality of the bowel preparation                        was good. Findings:      The terminal ileum appeared normal.      Small polyp like lesions likely representing lymphoid follicles were       noted. The random terminal ileum biopsies also included biopsies from       the follicles. Biopsies were taken with a cold forceps for histology.      A 3 to 4 mm polyp was found in the ascending colon. The polyp was       sessile. The polyp was removed with a cold biopsy forceps. Resection and       retrieval were complete.      A 3 to 4 mm polyp was found in the descending colon. The polyp was       sessile. The polyp was removed with a cold biopsy forceps. Resection and       retrieval were complete.      Two sessile polyps were found in the sigmoid colon. The polyps were 2 to       3 mm in size. These polyps were removed with a cold biopsy forceps.       Resection and retrieval were complete.      Multiple small-mouthed diverticula were found in the sigmoid colon.      The exam was otherwise without abnormality.      Internal hemorrhoids were found. The hemorrhoids were small.      A lesion in the left colon was seen that could be normal colon or a       polyp - biopsy was performed and placed in a separate left colon lesion       jar. Rectal mucosa was biopsied separately and placed in rectal biopsy       jar to evaluate for proctitis. Impression:           - The examined portion of the ileum was normal.                       - One 3 to 4 mm polyp in the ascending colon, removed                        with a cold biopsy forceps. Resected and retrieved.                       - One 3 to 4 mm polyp in the descending colon, removed                        with a cold biopsy forceps. Resected and retrieved.                       -  Two 2 to 3 mm polyps in the sigmoid colon, removed                         with a cold biopsy forceps. Resected and retrieved.                       - Diverticulosis in the sigmoid colon.                       - The examination was otherwise normal.                       - Internal hemorrhoids. Recommendation:       - Await pathology results.                       - Repeat colonoscopy in 5 years for surveillance.                       - Return to my office in 4 weeks.                       - High fiber diet.                       - Continue present medications.                       - Return to primary care physician as previously                        scheduled.                       - The findings and recommendations were discussed with                        the patient's family.                       - The findings and recommendations were discussed with                        the patient. Procedure Code(s):    --- Professional ---                       [phone], Colonoscopy, flexible; with biopsy, single or                        multiple Diagnosis Code(s):    --- Professional ---                       Z12.11, Encounter for screening for malignant neoplasm                        of colon                       K64.8, Other hemorrhoids                       D12.2, Benign neoplasm of ascending colon  D12.4, Benign neoplasm of descending colon                       D12.5, Benign neoplasm of sigmoid colon                       K57.30, Diverticulosis of large intestine without                        perforation or abscess without bleeding CPT copyright 2016 American Medical Association. All rights reserved. The codes documented in this report are preliminary and upon coder review may  be revised to meet current compliance requirements.  [name], MD Margretta Sidle B. Bonna Gains MD, MD 08/20/2017 12:08:47 PM This report has been signed electronically. Number of Addenda: 0 Note Initiated On: 08/20/2017 11:01 AM Scope Withdrawal Time: 0  hours 24 minutes 51 seconds  Total Procedure Duration: 0 hours 28 minutes 47 seconds       [facility]

## 2017-08-20 NOTE — Anesthesia Preprocedure Evaluation (Signed)
Anesthesia Evaluation  Patient identified by MRN, date of birth, ID band Patient awake    Reviewed: Allergy & Precautions, NPO status , Patient's Chart, lab work & pertinent test results  History of Anesthesia Complications Negative for: history of anesthetic complications  Airway Mallampati: II       Dental   Pulmonary neg sleep apnea, neg COPD,           Cardiovascular hypertension, Pt. on medications (-) Past MI and (-) CHF (-) dysrhythmias + Valvular Problems/Murmurs (murmur, no tx)      Neuro/Psych neg Seizures    GI/Hepatic Neg liver ROS, GERD  Medicated and Controlled,  Endo/Other  neg diabetes  Renal/GU negative Renal ROS     Musculoskeletal   Abdominal   Peds  Hematology   Anesthesia Other Findings   Reproductive/Obstetrics                             Anesthesia Physical Anesthesia Plan  ASA: II  Anesthesia Plan: General   Post-op Pain Management:    Induction: Intravenous  PONV Risk Score and Plan: 2 and TIVA and Propofol infusion  Airway Management Planned: Nasal Cannula  Additional Equipment:   Intra-op Plan:   Post-operative Plan:   Informed Consent: I have reviewed the patients History and Physical, chart, labs and discussed the procedure including the risks, benefits and alternatives for the proposed anesthesia with the patient or authorized representative who has indicated his/her understanding and acceptance.     Plan Discussed with:   Anesthesia Plan Comments:         Anesthesia Quick Evaluation

## 2017-08-20 NOTE — H&P (Signed)
  [name], MD [address] [place], [place], [place], [address] [address], [place], [place], [place], [address] Phone: [phone]  Fax: [phone]  Primary Care Physician:  Jinny Sanders, MD   Pre-Procedure History & Physical: HPI:  Kyle Johnson is a 47 y.o. male is here for an EGD and colonoscopy.   Past Medical History:  Diagnosis Date  . GERD (gastroesophageal reflux disease)   . Heart murmur   . Hypertension     History reviewed. No pertinent surgical history.  Prior to Admission medications   Medication Sig Start Date End Date Taking? Authorizing Provider  amLODipine (NORVASC) 5 MG tablet TAKE 1 TABLET (5 MG TOTAL) BY MOUTH DAILY. 04/19/17  Yes Bedsole, Amy E, MD  pantoprazole (PROTONIX) 40 MG tablet Take 1 tablet (40 mg total) by mouth daily. 06/18/17  Yes Jinny Sanders, MD    Allergies as of 08/02/2017 - Review Complete 07/30/2017  Allergen Reaction Noted  . Aspirin    . Ibuprofen Other (See Comments) 09/23/2016  . Milk-related compounds    . Penicillins    . Propoxyphene hcl    . Tetanus toxoids  11/11/2016    Family History  Problem Relation Age of Onset  . Hypertension Mother   . Stroke Mother   . Heart disease Maternal Grandfather   . Dementia Maternal Grandfather     Social History   Socioeconomic History  . Marital status: Married    Spouse name: Not on file  . Number of children: Not on file  . Years of education: Not on file  . Highest education level: Not on file  Social Needs  . Financial resource strain: Not on file  . Food insecurity - worry: Not on file  . Food insecurity - inability: Not on file  . Transportation needs - medical: Not on file  . Transportation needs - non-medical: Not on file  Occupational History  . Not on file  Tobacco Use  . Smoking status: Never Smoker  . Smokeless tobacco: Never Used  Substance and Sexual Activity  . Alcohol use: No  . Drug use: No  . Sexual activity: Not on file    Other Topics Concern  . Not on file  Social History Narrative  . Not on file    Review of Systems: See HPI, otherwise negative ROS  Physical Exam: BP (!) 158/87   Pulse 93   Temp (!) 96 F (35.6 C) (Tympanic)   Resp 18   Wt 167 lb (75.8 kg)   SpO2 100%   BMI 24.66 kg/m  General:   Alert,  pleasant and cooperative in NAD Head:  Normocephalic and atraumatic. Neck:  Supple; no masses or thyromegaly. Lungs:  Clear throughout to auscultation, normal respiratory effort.    Heart:  +S1, +S2, Regular rate and rhythm, No edema. Abdomen:  Soft, nontender and nondistended. Normal bowel sounds, without guarding, and without rebound.   Neurologic:  Alert and  oriented x4;  grossly normal neurologically.  Impression/Plan: Kyle Johnson is here for an EGD and colonoscopy to be performed for GERD and  CRC screening   Risks, benefits, limitations, and alternatives regarding  colonoscopy have been reviewed with the patient.  Questions have been answered.  All parties agreeable.   Virgel Manifold, MD  08/20/2017, 11:00 AM

## 2017-08-20 NOTE — Anesthesia Post-op Follow-up Note (Signed)
Anesthesia QCDR form completed.        

## 2017-08-23 ENCOUNTER — Encounter: Payer: Self-pay | Admitting: Gastroenterology

## 2017-08-27 LAB — SURGICAL PATHOLOGY

## 2017-09-10 ENCOUNTER — Encounter: Payer: Self-pay | Admitting: Gastroenterology

## 2017-09-13 ENCOUNTER — Telehealth: Payer: Self-pay

## 2017-09-13 NOTE — Telephone Encounter (Signed)
Patient has been informed of results.  Has appt to see Dr. Bonna Gains for tomorrow to discuss.

## 2017-09-13 NOTE — Telephone Encounter (Signed)
-----   Message from Virgel Manifold, MD sent at 09/10/2017  4:20 PM EST ----- [name] please let patient know, his biopsy results showed small amount of eosinophils and collagen deposition in his colon and small bowel.  This might be leading to his diarrhea.  We need to see him in clinic to discuss further management.  He has an appointment to see me on 8 February.  Please let Shaunna or Traci know if he is available earlier than that, please have him see me in clinic next available.  I am ccing them to this message as well thank you

## 2017-09-13 NOTE — Telephone Encounter (Signed)
Patient has been informed his biopsy results showed small amount of eosinophils and collagen deposition in his colon and small bowel which could be leading to his diarrhea.  He has an appt scheduled to see you tomorrow.

## 2017-09-14 ENCOUNTER — Encounter: Payer: Self-pay | Admitting: Gastroenterology

## 2017-09-14 ENCOUNTER — Ambulatory Visit: Payer: BC Managed Care – PPO | Admitting: Gastroenterology

## 2017-09-14 ENCOUNTER — Other Ambulatory Visit
Admission: RE | Admit: 2017-09-14 | Discharge: 2017-09-14 | Disposition: A | Discharge status: Home / Self Care | Payer: BC Managed Care – PPO | Source: Ambulatory Visit | Attending: Gastroenterology | Admitting: Gastroenterology

## 2017-09-14 ENCOUNTER — Other Ambulatory Visit: Payer: Self-pay

## 2017-09-14 VITALS — BP 121/76 | HR 76 | Ht 69.0 in | Wt 179.2 lb

## 2017-09-14 DIAGNOSIS — K5281 Eosinophilic gastritis or gastroenteritis: Secondary | ICD-10-CM | POA: Insufficient documentation

## 2017-09-14 LAB — CBC WITH DIFFERENTIAL/PLATELET
Basophils Absolute: 0.1 10*3/uL (ref 0–0.1)
Basophils Relative: 1 %
EOS PCT: 2 %
Eosinophils Absolute: 0.1 10*3/uL (ref 0–0.7)
HCT: 43.8 % (ref 40.0–52.0)
HEMOGLOBIN: 14.7 g/dL (ref 13.0–18.0)
LYMPHS ABS: 1 10*3/uL (ref 1.0–3.6)
LYMPHS PCT: 16 %
MCH: 27.7 pg (ref 26.0–34.0)
MCHC: 33.4 g/dL (ref 32.0–36.0)
MCV: 83 fL (ref 80.0–100.0)
Monocytes Absolute: 0.8 10*3/uL (ref 0.2–1.0)
Monocytes Relative: 13 %
NEUTROS PCT: 68 %
Neutro Abs: 4 10*3/uL (ref 1.4–6.5)
Platelets: 160 10*3/uL (ref 150–440)
RBC: 5.28 MIL/uL (ref 4.40–5.90)
RDW: 13.7 % (ref 11.5–14.5)
WBC: 6 10*3/uL (ref 3.8–10.6)

## 2017-09-14 LAB — IRON AND TIBC
IRON: 20 ug/dL — AB (ref 45–182)
SATURATION RATIOS: 7 % — AB (ref 17.9–39.5)
TIBC: 292 ug/dL (ref 250–450)
UIBC: 272 ug/dL

## 2017-09-14 LAB — COMPREHENSIVE METABOLIC PANEL
ALBUMIN: 4.3 g/dL (ref 3.5–5.0)
ALK PHOS: 73 U/L (ref 38–126)
ALT: 16 U/L — AB (ref 17–63)
AST: 18 U/L (ref 15–41)
Anion gap: 8 (ref 5–15)
BUN: 13 mg/dL (ref 6–20)
CHLORIDE: 104 mmol/L (ref 101–111)
CO2: 26 mmol/L (ref 22–32)
CREATININE: 1.02 mg/dL (ref 0.61–1.24)
Calcium: 8.8 mg/dL — ABNORMAL LOW (ref 8.9–10.3)
GFR calc Af Amer: >60 mL/min (ref >60)
GFR calc non Af Amer: >60 mL/min (ref >60)
GLUCOSE: 114 mg/dL — AB (ref 65–99)
Potassium: 3.2 mmol/L — ABNORMAL LOW (ref 3.5–5.1)
SODIUM: 138 mmol/L (ref 135–145)
Total Bilirubin: 1.4 mg/dL — ABNORMAL HIGH (ref 0.3–1.2)
Total Protein: 6.7 g/dL (ref 6.5–8.1)

## 2017-09-14 LAB — C-REACTIVE PROTEIN: CRP: 4.2 mg/dL — AB (ref <1.0)

## 2017-09-14 LAB — FERRITIN: Ferritin: 362 ng/mL — ABNORMAL HIGH (ref 24–336)

## 2017-09-14 LAB — VITAMIN B12: Vitamin B-12: 165 pg/mL — ABNORMAL LOW (ref 180–914)

## 2017-09-14 LAB — SEDIMENTATION RATE: Sed Rate: 6 mm/hr (ref 0–15)

## 2017-09-14 LAB — TROPONIN I: Troponin I: <0.03 ng/mL (ref <0.03)

## 2017-09-14 NOTE — Progress Notes (Signed)
Kyle Antigua, MD [address]  [place]  [place], [place] [address]  Main: [phone]  Fax: [phone]   Primary Care Physician: Jinny Sanders, MD  Primary Gastroenterologist:  Dr. Vonda Johnson  Chief Complaint  Patient presents with  . Follow up procedure results    HPI: Kyle Johnson is a 47 y.o. male here for follow-up after colonoscopy and EGD, and for diarrhea.  Patient reports diarrhea has completely resolved.  He reports having 1 formed bowel movement daily for 3-4 weeks.  No blood.  No abdominal pain.  No nausea vomiting.  Good appetite.  Acid reflux is well controlled with Protonix 40 mg daily.  See colonoscopy and EGD report from August 20, 2017.  Colonic and terminal ileum mucosa was normal and random biopsies were taken.  See pathology report that showed mucosal eosinophilia in terminal ileum and colonic biopsies.  Mild subepithelial collagen deposition was also seen on the colon biopsies.  Sessile serrated adenoma and hyperplastic polyps were removed. esophageal biopsies did not show any eosinophils.  Patient was initially seen on July 30, 2017 when he was referred for GERD, weight loss, diarrhea.  Patient reported losing 20 pounds since August - November 2018.  And reported intermittent episodes of acid reflux after eating, and followed by a 4-5 loose bowel movements immediately after.  He was started on Prilosec by his primary care provider initially, and then switched to pantoprazole 40 mg daily as symptoms were uncontrolled.  Patient denies taking any daily NSAIDs in the past.  Current Outpatient Medications  Medication Sig Dispense Refill  . amLODipine (NORVASC) 5 MG tablet TAKE 1 TABLET (5 MG TOTAL) BY MOUTH DAILY. 90 tablet 1  . pantoprazole (PROTONIX) 40 MG tablet Take 1 tablet (40 mg total) by mouth daily. 30 tablet 3   No current facility-administered medications for this visit.     Allergies as of 09/14/2017 - Review Complete  09/14/2017  Allergen Reaction Noted  . Aspirin    . Ibuprofen Other (See Comments) 09/23/2016  . Milk-related compounds    . Penicillins    . Propoxyphene hcl    . Tetanus toxoids  11/11/2016    ROS:  General: Negative for anorexia, weight loss, fever, chills, fatigue, weakness. ENT: Negative for hoarseness, difficulty swallowing , nasal congestion. CV: Negative for chest pain, angina, palpitations, dyspnea on exertion, peripheral edema.  Respiratory: Negative for dyspnea at rest, dyspnea on exertion, cough, sputum, wheezing.  GI: See history of present illness. GU:  Negative for dysuria, hematuria, urinary incontinence, urinary frequency, nocturnal urination.  Endo: Negative for unusual weight change.    Physical Examination:   BP 121/76   Pulse 76   Ht 5\' 9"  (1.753 m)   Wt 179 lb 3.2 oz (81.3 kg)   BMI 26.46 kg/m   General: Well-nourished, well-developed in no acute distress.  Eyes: No icterus. Conjunctivae pink. Mouth: Oropharyngeal mucosa moist and pink , no lesions erythema or exudate. Lungs: Clear to auscultation bilaterally. Non-labored. Heart: Regular rate and rhythm, no murmurs rubs or gallops.  Abdomen: Bowel sounds are normal, nontender, nondistended, no hepatosplenomegaly or masses, no abdominal bruits or hernia , no rebound or guarding.   Extremities: No lower extremity edema. No clubbing or deformities. Neuro: Alert and oriented x 3.  Grossly intact. Skin: Warm and dry, no jaundice.   Psych: Alert and cooperative, normal mood and affect.   Labs: CMP     Component Value Date/Time   NA 138 09/14/2017 1322  NA 138 07/13/2013 0041   K 3.2 (L) 09/14/2017 1322   K 3.9 07/13/2013 0041   CL 104 09/14/2017 1322   CL 103 07/13/2013 0041   CO2 26 09/14/2017 1322   CO2 27 07/13/2013 0041   GLUCOSE 114 (H) 09/14/2017 1322   GLUCOSE 142 (H) 07/13/2013 0041   BUN 13 09/14/2017 1322   BUN 15 07/13/2013 0041   CREATININE 1.02 09/14/2017 1322   CREATININE 0.98  07/13/2013 0041   CALCIUM 8.8 (L) 09/14/2017 1322   CALCIUM 9.4 07/13/2013 0041   PROT 6.7 09/14/2017 1322   PROT 7.9 07/13/2013 0041   ALBUMIN 4.3 09/14/2017 1322   ALBUMIN 4.4 07/13/2013 0041   AST 18 09/14/2017 1322   AST 27 07/13/2013 0041   ALT 16 (L) 09/14/2017 1322   ALT 48 07/13/2013 0041   ALKPHOS 73 09/14/2017 1322   ALKPHOS 95 07/13/2013 0041   BILITOT 1.4 (H) 09/14/2017 1322   BILITOT 0.6 07/13/2013 0041   GFRNONAA >60 09/14/2017 1322   GFRNONAA >60 07/13/2013 0041   GFRAA >60 09/14/2017 1322   GFRAA >60 07/13/2013 0041   Lab Results  Component Value Date   WBC 6.0 09/14/2017   HGB 14.7 09/14/2017   HCT 43.8 09/14/2017   MCV 83.0 09/14/2017   PLT 160 09/14/2017    Imaging Studies: No results found.  Assessment and Plan:   [name] is a 47 y.o. y/o male with history of anxiety, initially referred for 20 pound weight loss over 2 months, with intermittent episodes of acid reflux followed by diarrhea which have resolved after starting pantoprazole 40 mg daily, with colon and terminal ileum biopsy showing mucosal eosinophilia and subepithelial collagen deposition  Patient denies any recent history of travel or sick contacts NSAID use is associated with mucosal eosinophilia GI biopsies.  Patient denies any daily NSAID use in the past or recently. PPIs can also be associated with this, but patient symptoms were present prior to initiating PPI.  In addition the PPI use has significantly helped with his acid reflux as per patient.  His diarrhea is completely resolved at this time We will need to obtain further extensive workup for eosinophilic enteritis.  We will need to rule out any underlying parasitic infections.  Will obtain CBC with differential to evaluate eosinophil count.  We will also need to obtain IgE levels and immunoglobulin levels.  Will need to obtain iron workup as well.  Will need to obtain stool studies as well.  This was all discussed with the  patient and patient is willing to undergo further workup.  We will also try to obtain second opinion on biopsies from GI pathologist.  This is to help identify if the findings are from eosinophilic enteritis/colitis or microscopic colitis. Depending on symptoms, 6 food elimination diet versus budesonide can be considered. All of this was also discussed with the patient and he verbalized understanding.   If symptoms recur or worsen before next visit he will call us We will continue to follow closely in clinic  Dr Kyle Johnson

## 2017-09-15 LAB — IGG, IGA, IGM
IGA: 248 mg/dL (ref 90–386)
IGG (IMMUNOGLOBIN G), SERUM: 684 mg/dL — AB (ref 700–1600)
IgM (Immunoglobulin M), Srm: 35 mg/dL (ref 20–172)

## 2017-09-15 LAB — HIV ANTIBODY (ROUTINE TESTING W REFLEX): HIV SCREEN 4TH GENERATION: NONREACTIVE

## 2017-09-16 LAB — IGE: IGE (IMMUNOGLOBULIN E), SERUM: 121 [IU]/mL — AB (ref 0–100)

## 2017-09-16 LAB — TRYPTASE: Tryptase: 4.7 ug/L (ref 2.2–13.2)

## 2017-09-17 ENCOUNTER — Other Ambulatory Visit: Payer: Self-pay | Admitting: Gastroenterology

## 2017-09-17 DIAGNOSIS — A09 Infectious gastroenteritis and colitis, unspecified: Secondary | ICD-10-CM

## 2017-09-17 LAB — GASTROINTESTINAL PANEL BY PCR, STOOL (REPLACES STOOL CULTURE)

## 2017-09-17 LAB — C DIFFICILE QUICK SCREEN W PCR REFLEX
C DIFFICILE (CDIFF) TOXIN: NEGATIVE
C Diff antigen: NEGATIVE
C Diff interpretation: NOT DETECTED

## 2017-09-20 LAB — FECAL FAT, QUALITATIVE
FAT QUAL TOTAL STL: NORMAL
Fat Qual Neutral, Stl: NORMAL

## 2017-09-21 ENCOUNTER — Telehealth: Payer: Self-pay

## 2017-09-21 LAB — PANCREATIC ELASTASE, FECAL: Pancreatic Elastase-1, Stool: >500 ug Elast./g (ref >200)

## 2017-09-21 NOTE — Telephone Encounter (Signed)
Pt 's wife Sharyn Lull calls in regards to the labs that need to be done at [facility] Given 2 addresses for [facility] located in [place]. Noted that they send [facility] results via fax. Sharyn Lull asked if he could do these at [facility] where he works and I informed her that was fine if they could and send [facility] results. Copies of lab req made for pt to pick up and take with him.

## 2017-09-22 ENCOUNTER — Telehealth: Payer: Self-pay

## 2017-09-22 NOTE — Telephone Encounter (Signed)
-----   Message from Virgel Manifold, MD sent at 09/15/2017 11:39 AM EST ----- Kyle Johnson,  Can we see if we can contact [facility] and send them the pathology slides from his colonoscopy to Dr. Jaquita Folds.   Reason: Evaluate Eosinophilic Enteritis/Colitis vs. Microscopic Colitis History: Pt. With diarrhea and 20 lb weight loss  You will have to contact their office and tell them we want to send them some slides for second opinion.  And asked them how to go about it  Their contact info is as follows:  [place]  [address], [place], [place] [address] [place]   Phone: [phone]  Fax: [phone]  E-Mail: [email or web address]  [facility] provides daily specimen pickup throughout the [place]. Please contact us to determine which courier service is available in your area.    Client Services:  [address]  [place], [place] [address]  [place]    Business Office:  [address]  [place], [place] [address]  P [phone]  P [phone]  F [phone]

## 2017-09-22 NOTE — Telephone Encounter (Signed)
09/21/17 Left message for Kieth Brightly at [facility] to contact me.

## 2017-09-23 ENCOUNTER — Telehealth: Payer: Self-pay | Admitting: Gastroenterology

## 2017-09-23 NOTE — Telephone Encounter (Signed)
Patients wife called and patient has been throwing up. Started at midnight and really bad diarrhea. Dr. Bonna Gains wanted to know when an episode like this started. Please call wife.

## 2017-09-23 NOTE — Telephone Encounter (Signed)
I contacted pt's wife and she stated he was sick until about 5:30 am and since has been sleeping and drinking gatorade/fluids off/on since. It was recommended by Dr. Bonna Gains that pt may need to go to ER if dehydrated or have pt come to office tomorrow.  Wife declined appt for tomorrow and states pt is no longer having n/v, diarrhea. He is weak but is drinking.

## 2017-09-24 ENCOUNTER — Other Ambulatory Visit: Payer: Self-pay

## 2017-09-24 ENCOUNTER — Other Ambulatory Visit: Payer: Self-pay | Admitting: Gastroenterology

## 2017-09-24 ENCOUNTER — Telehealth: Payer: Self-pay

## 2017-09-24 ENCOUNTER — Ambulatory Visit: Payer: BC Managed Care – PPO | Admitting: Gastroenterology

## 2017-09-24 DIAGNOSIS — E538 Deficiency of other specified B group vitamins: Secondary | ICD-10-CM

## 2017-09-24 DIAGNOSIS — R634 Abnormal weight loss: Secondary | ICD-10-CM

## 2017-09-24 DIAGNOSIS — R748 Abnormal levels of other serum enzymes: Secondary | ICD-10-CM

## 2017-09-24 DIAGNOSIS — R768 Other specified abnormal immunological findings in serum: Secondary | ICD-10-CM

## 2017-09-24 DIAGNOSIS — D803 Selective deficiency of immunoglobulin G [IgG] subclasses: Secondary | ICD-10-CM

## 2017-09-24 DIAGNOSIS — R7689 Other specified abnormal immunological findings in serum: Secondary | ICD-10-CM

## 2017-09-24 DIAGNOSIS — R1115 Cyclical vomiting syndrome unrelated to migraine: Secondary | ICD-10-CM

## 2017-09-24 NOTE — Telephone Encounter (Signed)
[facility] for appt for referral to hematology.

## 2017-09-24 NOTE — Telephone Encounter (Signed)
Kieth Brightly from [facility] does not know about [employer].

## 2017-09-24 NOTE — Progress Notes (Signed)
Pt. Had an episode of Nausea/vomiting/Diarrhea that started at 5 pm on Wednesday (today is Friday morning), lasted through Thursday morning and then he slept all day Thursday and symptoms resolved. My nurse talked to him and his wife yesterday and asked him to go the ER if symptoms were ongoing and they refused. She offered to have him seen in the clinic with me today, and they refused as well.   I talked to the patient today and he states symptoms have resolved and he had solid food for dinner yesterday and today morning and no N/V or diarrhea.   I had ordered [facility] last week and he was asked to go get this done but he has not done that yet. I have asked him again to go today, as it is to rule out parasitic infection. I have also added more labs to this in addition.   I talked to the pathologist Dr. Reuel Derby and she stated the amount of eosinophils on his biopsies are barely at the upper limit of normal and she does not think the findings are from vasculitis, or eosinophilic enterocolitis.  She states the amount of eosinophils are barely enough to even call abnormal.  I asked her if PAS staining for diagnosis of Whipple's disease would be beneficial.  She stated there are no histiocytes on the samples to indicate PAS testing.  I also discussed the finding of low IgG on his serology, and specifically asked Dr. Reuel Derby, if his biopsies show evidence of hypogammaglobulinemia, CVID, with decrease or absence of cells etc.  She stated the biopsies did not show any low plasma cells are low cells.  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  Will need to rule out parasitic infections, and patient was asked again to go to the lab today to get this done. We will  recheck CRP as it was mildly elevated on the ESR was normal at that time We will recheck CMP, as bilirubin is mildly elevated intermittent nausea vomiting. B12 was noted to be low, will check MMA, homocystine levels, intrinsic factor antibodies. Interestingly, his IgE was mildly elevated, and IgG was just below the lower limit of normal.  This was discussed with Dr. Reuel Derby as above Patient denied any previous history of asthma, seasonal allergy allergic history. Denied any previous NSAID use that could explain findings PPIs were only Started recently and symptoms were present prior to that.  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  First-line treatment is 6 food elimination diet.  This would be the safest step in this patient given the questionable diagnosis, instead of staring steroids which would be a second line treatment.  Handout for 6 food emanation diet was given to patient and wife on the last clinic visit.  He has not started this yet.  I discussed this with him over the phone,  he still has the handout, and is willing to try it.  I have asked him to see religiously followed to 6 food elimination diet and will monitor improvement in symptoms.  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  If symptoms do not improve, can consider budesonide if no other diagnoses evident and no other contraindications for such after above testing.  We will also refer to hematology due to elevated IgE and decreased IgG  I have asked the patient again  to go to the ER if he is feeling weak, to retest his electrolytes and obtain stat labs, as his previous CMP showed mildly decreased potassium levels.  If he does not go to the ER, I have urged him to go get his [facility] labs done today, as have ordered CMP to reevaluate his electrolytes.  He verbalized understanding.

## 2017-09-27 NOTE — Telephone Encounter (Signed)
Wife calls, stating pt is confused about the referral from hematology (scheduled on 10/01/17). Also would like to know about lab work done on 09/14/17. I explained about the referral and some of the lab results. Will have Dr. Bonna Gains call wife.  Wife Sharyn Lull) # [phone].

## 2017-09-27 NOTE — Telephone Encounter (Signed)
I have contacted [facility] for lab results of which was done on Saturday 09/18/17 and they are pending. Will fax [place] the results. Fax number given.

## 2017-09-28 ENCOUNTER — Telehealth: Payer: Self-pay

## 2017-09-28 NOTE — Telephone Encounter (Signed)
Talked to patient's wife. All questions answered to their satisfaction. Importance of six food elimination diet discussed again.

## 2017-09-28 NOTE — Telephone Encounter (Signed)
-----   Message from Virgel Manifold, MD sent at 09/28/2017  1:32 PM EST ----- [name] please call the patient and ask him if he works at [facility] [employer] [facility] [employer] [facility] would he like to be referred there for Allergy/Immunology for skin testing for allergies. If so, please refer him to [facility] for this. Indication: Elevated serum IgE, mucosal eosinophils on colon biopsies, evaluate for allergy testing.  If not, please call [facility] and inquire if they do allergy testing and if so refer him there.

## 2017-09-28 NOTE — Telephone Encounter (Signed)
I spoke with pt's wife regarding referral to [facility] or possibly [facility].  She thinks [facility] may be better because of it being closer to home and to do on Fridays. I will let her know tomorrow regarding appointment.

## 2017-09-29 ENCOUNTER — Other Ambulatory Visit: Payer: Self-pay

## 2017-09-29 NOTE — Treatment Plan (Signed)
This note is for phone conversation on 09/24/17  Pt. Had an episode of Nausea/vomiting/Diarrhea that started at 5 pm on Wednesday (today is Friday morning), lasted through Thursday morning and then he slept all day Thursday and symptoms resolved. My nurse talked to him and his wife yesterday and asked him to go the ER if symptoms were ongoing and they refused. She offered to have him seen in the clinic with me today, and they refused as well.   I talked to the patient today and he states symptoms have resolved and he had solid food for dinner yesterday and today morning and no N/V or diarrhea.   I had ordered [facility] last week and he was asked to go get this done but he has not done that yet. I have asked him again to go today, as it is to rule out parasitic infection. I have also added more labs to this in addition.   I talked to the pathologist Dr. Reuel Derby and she stated the amount of eosinophils on his biopsies are barely at the upper limit of normal and she does not think the findings are from vasculitis, or eosinophilic enterocolitis.  She states the amount of eosinophils are barely enough to even call abnormal.  I asked her if PAS staining for diagnosis of Whipple's disease would be beneficial.  She stated there are no histiocytes on the samples to indicate PAS testing.  I also discussed the finding of low IgG on his serology, and specifically asked Dr. Reuel Derby, if his biopsies show evidence of hypogammaglobulinemia, CVID, with decrease or absence of cells etc.  She stated the biopsies did not show any low plasma cells are low cells.  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  Will need to rule out parasitic infections, and patient was asked  again to go to the lab today to get this done. We will recheck CRP as it was mildly elevated on the ESR was normal at that time We will recheck CMP, as bilirubin is mildly elevated intermittent nausea vomiting. B12 was noted to be low, will check MMA, homocystine levels, intrinsic factor antibodies. Interestingly, his IgE was mildly elevated, and IgG was just below the lower limit of normal.  This was discussed with Dr. Reuel Derby as above Patient denied any previous history of asthma, seasonal allergy allergic history. Denied any previous NSAID use that could explain findings PPIs were only Started recently and symptoms were present prior to that.  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  First-line treatment is 6 food elimination diet.  This would be the safest step in this patient given the questionable diagnosis, instead of staring steroids which would be a second line treatment.  Handout for 6 food emanation diet was given to patient and wife on the last clinic visit.  He has not started this yet.  I discussed this with him over the phone, he still has the handout, and is willing to try it.  I have asked him to see religiously followed to 6 food elimination diet and will monitor improvement in symptoms.  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  If symptoms do not improve, can consider budesonide if no other diagnoses evident and no other contraindications for such after above testing.  We will also refer to hematology due to  elevated IgE and decreased IgG  I have asked the patient again to go to the ER if he is feeling weak, to retest his electrolytes and obtain stat labs, as his previous CMP showed mildly decreased potassium levels.  If he does not go to the ER, I have urged him to go get his [facility] labs done today, as have ordered CMP to reevaluate his electrolytes.  He verbalized understanding.

## 2017-09-29 NOTE — Telephone Encounter (Signed)
Contacted [facility] in [place] and set up an appt for November 12, 2017 at 1:45 pm with Dr. Orvil Feil. Pt is not to take antihistamine 3 days prior to appointment, bring insurance card and co-pay. To give a 24 hr. Notice if need to cancel.  This was the soonest appt they had.  Phone: [phone] Referral faxed to [phone].  Sharyn Lull (wife) notified.   Wife calls back and pt wanted sooner appt and it does not have to be a Friday.  Appointment made for: Feb. 26, 2019 at 10:45 am with Dr. Donneta Romberg, in [place].  Same instructions apply. This was sent to pt's My Chart also. Sharyn Lull (wife) notified.

## 2017-09-29 NOTE — Telephone Encounter (Deleted)
-----   Message from Virgel Manifold, MD sent at 09/28/2017  1:32 PM EST ----- [name] please call the patient and ask him if he works at [employer] [facility] [employer] would he like to be referred there for Allergy/Immunology for skin testing for allergies. If so, please refer him to [facility] for this. Indication: Elevated serum IgE, mucosal eosinophils on colon biopsies, evaluate for allergy testing.  If not, please call [facility] and inquire if they do allergy testing and if so refer him there.

## 2017-09-29 NOTE — Telephone Encounter (Signed)
Request sent via fax to [facility], Histology: Fax: [phone] Phone: [phone] For pathology slides from colonoscopy done on 08/20/17 to be sent to [facility]. Detailed information with address, phone, fax, etc. Also sent with consult.

## 2017-10-01 ENCOUNTER — Encounter: Payer: Self-pay | Admitting: Oncology

## 2017-10-01 ENCOUNTER — Inpatient Hospital Stay: Payer: BC Managed Care – PPO

## 2017-10-01 ENCOUNTER — Inpatient Hospital Stay: Payer: BC Managed Care – PPO | Attending: Oncology | Admitting: Oncology

## 2017-10-01 VITALS — BP 119/80 | HR 80 | Temp 97.3°F | Resp 18 | Ht 69.0 in | Wt 178.8 lb

## 2017-10-01 DIAGNOSIS — R011 Cardiac murmur, unspecified: Secondary | ICD-10-CM | POA: Diagnosis not present

## 2017-10-01 DIAGNOSIS — K219 Gastro-esophageal reflux disease without esophagitis: Secondary | ICD-10-CM | POA: Diagnosis not present

## 2017-10-01 DIAGNOSIS — I10 Essential (primary) hypertension: Secondary | ICD-10-CM

## 2017-10-01 DIAGNOSIS — E7800 Pure hypercholesterolemia, unspecified: Secondary | ICD-10-CM

## 2017-10-01 DIAGNOSIS — Z79899 Other long term (current) drug therapy: Secondary | ICD-10-CM | POA: Diagnosis not present

## 2017-10-01 DIAGNOSIS — D721 Eosinophilia: Secondary | ICD-10-CM | POA: Diagnosis not present

## 2017-10-01 DIAGNOSIS — Z8719 Personal history of other diseases of the digestive system: Secondary | ICD-10-CM | POA: Diagnosis not present

## 2017-10-01 DIAGNOSIS — R634 Abnormal weight loss: Secondary | ICD-10-CM

## 2017-10-01 DIAGNOSIS — K9289 Other specified diseases of the digestive system: Secondary | ICD-10-CM

## 2017-10-01 DIAGNOSIS — R768 Other specified abnormal immunological findings in serum: Secondary | ICD-10-CM

## 2017-10-01 LAB — CBC WITH DIFFERENTIAL/PLATELET
BASOS PCT: 1 %
Basophils Absolute: 0.1 10*3/uL (ref 0–0.1)
EOS ABS: 0.2 10*3/uL (ref 0–0.7)
Eosinophils Relative: 3 %
HCT: 41.3 % (ref 40.0–52.0)
HEMOGLOBIN: 14 g/dL (ref 13.0–18.0)
Lymphocytes Relative: 23 %
Lymphs Abs: 1.3 10*3/uL (ref 1.0–3.6)
MCH: 28 pg (ref 26.0–34.0)
MCHC: 33.9 g/dL (ref 32.0–36.0)
MCV: 82.7 fL (ref 80.0–100.0)
Monocytes Absolute: 0.4 10*3/uL (ref 0.2–1.0)
Monocytes Relative: 7 %
NEUTROS PCT: 66 %
Neutro Abs: 3.9 10*3/uL (ref 1.4–6.5)
PLATELETS: 206 10*3/uL (ref 150–440)
RBC: 5 MIL/uL (ref 4.40–5.90)
RDW: 12.9 % (ref 11.5–14.5)
WBC: 5.8 10*3/uL (ref 3.8–10.6)

## 2017-10-01 MED ORDER — VITAMIN B-12 1000 MCG PO TABS: 1000.0000 ug | ORAL_TABLET | Freq: Every day | ORAL | 1 refills | Status: DC

## 2017-10-01 NOTE — Progress Notes (Signed)
Hematology/Oncology Consult note [facility] Telephone:([phone] [phone]  Patient Care Team: Jinny Sanders, MD as PCP - General   Name of the patient: Kyle Johnson  [id number]  14-Oct-1970    Reason for referral- elevated IgE   Referring physician- Dr. Bonna Gains  Date of visit: 10/01/17   History of presenting illness-patient is a 47 year old male who was initially seen by GI in December 2018 when he was having symptoms of nausea vomiting and diarrhea.  He reports that he was doing well up until 2017/04/10.  One find day he had passed out for dinner and the next day he had symptoms of abdominal pain nausea vomiting and diarrhea which lasted for about 8 hours and then subsided.  Since then he has had on and off such episodes which have sometimes lasted longer for a few days together.  He does not know if any particular foods trigger it.  He reports having lost about 15 pounds up until November 2018 after which his weight stabilized.  There was also some concern about symptoms of heartburn and acid reflux after eating for which he was on Prilosec followed by pantoprazole.    He was seen by Dr. Bonna Gains from GI and underwent EGD and colonoscopy in January 2019.  He had random biopsies taken from colon and terminal ileum.  It showed mild active enteritis with prominent reactive lymphoid aggregates.  Mild mucosal eosinophilia negative for dysplasia and malignancy.Colonic mucosa showed patchy mild subepithelial collagenous deposition with mild mucosal eosinophilia.  Again negative for malignancy. Mild mucosal eosinophilia is identified in the terminal ileal and  several of the colonic mucosal biopsies. The differential diagnosis for  an increase in eosinophils includes idiopathic eosinophilic  enterocolitis, medications, allergy, parasitic infection, idiopathic  inflammatory bowel disease, and connective tissue disorders and  vasculitis. In the colonic  biopsies patchy subepithelial collagen  deposition is noted raising the possibility of early collagenous  colitis. Of note, active ileitis, mucosal eosinophilia, and  subepithelial collagen deposition may all be associated with NSAID use.   He had further workup by GI which showed mildly decreased IgG level of 684.  IgE level was mildly elevated at 121 when the upper limit of normal is 100.  Serum tryptase was negative.  CBC from 09/14/2017 was normal with a normal differential.  No peripheral blood eosinophilia.  CRP was elevated at 4.2.  Iron study showed elevated ferritin of 362, low iron saturation of 7% and mildly low serum iron of 20. He has been referred to [place] for mildly elevated IgE levels     ECOG PS- 0  Pain scale- 0   Review of systems- Review of Systems  Constitutional: Positive for malaise/fatigue and weight loss. Negative for chills and fever.  HENT: Negative for congestion, ear discharge and nosebleeds.   Eyes: Negative for blurred vision.  Respiratory: Negative for cough, hemoptysis, sputum production, shortness of breath and wheezing.   Cardiovascular: Negative for chest pain, palpitations, orthopnea and claudication.  Gastrointestinal: Positive for nausea and vomiting. Negative for abdominal pain, blood in stool, constipation, diarrhea, heartburn and melena.  Genitourinary: Negative for dysuria, flank pain, frequency, hematuria and urgency.  Musculoskeletal: Negative for back pain, joint pain and myalgias.  Skin: Negative for rash.  Neurological: Negative for dizziness, tingling, focal weakness, seizures, weakness and headaches.  Endo/Heme/Allergies: Does not bruise/bleed easily.  Psychiatric/Behavioral: Negative for depression and suicidal ideas. The patient does not have insomnia.     Allergies  Allergen Reactions  .  Aspirin     REACTION: Nausea and vomiting  . Ibuprofen Other (See Comments)    Pt reports he does not remember why he can not take   .  Milk-Related Compounds     REACTION: GI  . Penicillins     REACTION: Nausea and vomiting  . Propoxyphene Hcl     REACTION: Rash  . Tetanus Toxoids     Patient Active Problem List   Diagnosis Date Noted  . Stomach irritation   . Heartburn   . Special screening for malignant neoplasms, colon   . Benign neoplasm of ascending colon   . Diverticulosis of large intestine without diverticulitis   . Viral gastroenteritis 05/16/2017  . GERD (gastroesophageal reflux disease) 05/16/2017  . Reaction to DTaP immunization 11/11/2016  . Right-sided chest wall pain 11/06/2016  . Allergic rhinitis 11/24/2012  . PURE HYPERCHOLESTEROLEMIA 12/14/2008  . Essential hypertension 11/18/2007  . HEART MURMUR, HX OF 11/18/2007     Past Medical History:  Diagnosis Date  . GERD (gastroesophageal reflux disease)   . Heart murmur   . Hypertension      Past Surgical History:  Procedure Laterality Date  . COLONOSCOPY WITH PROPOFOL N/A 08/20/2017   Procedure: COLONOSCOPY WITH PROPOFOL;  Surgeon: Virgel Manifold, MD;  Location: [facility] ENDOSCOPY;  Service: Endoscopy;  Laterality: N/A;  . ESOPHAGOGASTRODUODENOSCOPY (EGD) WITH PROPOFOL N/A 08/20/2017   Procedure: ESOPHAGOGASTRODUODENOSCOPY (EGD) WITH PROPOFOL;  Surgeon: Virgel Manifold, MD;  Location: [facility] ENDOSCOPY;  Service: Endoscopy;  Laterality: N/A;    Social History   Socioeconomic History  . Marital status: Married    Spouse name: Not on file  . Number of children: Not on file  . Years of education: Not on file  . Highest education level: Not on file  Social Needs  . Financial resource strain: Not on file  . Food insecurity - worry: Not on file  . Food insecurity - inability: Not on file  . Transportation needs - medical: Not on file  . Transportation needs - non-medical: Not on file  Occupational History  . Not on file  Tobacco Use  . Smoking status: Never Smoker  . Smokeless tobacco: Never Used  Substance and Sexual Activity  .  Alcohol use: No  . Drug use: No  . Sexual activity: Not on file  Other Topics Concern  . Not on file  Social History Narrative  . Not on file     Family History  Problem Relation Age of Onset  . Hypertension Mother   . Stroke Mother   . Heart disease Maternal Grandfather   . Dementia Maternal Grandfather      Current Outpatient Medications:  .  amLODipine (NORVASC) 5 MG tablet, TAKE 1 TABLET (5 MG TOTAL) BY MOUTH DAILY., Disp: 90 tablet, Rfl: 1 .  pantoprazole (PROTONIX) 40 MG tablet, Take 1 tablet (40 mg total) by mouth daily., Disp: 90 tablet, Rfl: 1 .  vitamin B-12 (CYANOCOBALAMIN) 1000 MCG tablet, Take 1 tablet (1,000 mcg total) by mouth daily., Disp: 30 tablet, Rfl: 1   Physical exam:  Vitals:   10/01/17 1405  BP: 119/80  Pulse: 80  Resp: 18  Temp: (!) 97.3 F (36.3 C)  TempSrc: Tympanic  Weight: 178 lb 12.8 oz (81.1 kg)  Height: _0  (1.753 m)   Physical Exam  Constitutional: He is oriented to person, place, and time and well-developed, well-nourished, and in no distress.  HENT:  Head: Normocephalic and atraumatic.  Eyes: EOM are normal. Pupils  are equal, round, and reactive to light.  Neck: Normal range of motion.  Cardiovascular: Normal rate, regular rhythm and normal heart sounds.  Pulmonary/Chest: Effort normal and breath sounds normal.  Abdominal: Soft. Bowel sounds are normal.  Neurological: He is alert and oriented to person, place, and time.  Skin: Skin is warm and dry.       CMP Latest Ref Rng & Units 09/14/2017  Glucose 65 - 99 mg/dL 114(H)  BUN 6 - 20 mg/dL 13  Creatinine 0.61 - 1.24 mg/dL 1.02  Sodium 135 - 145 mmol/L 138  Potassium 3.5 - 5.1 mmol/L 3.2(L)  Chloride 101 - 111 mmol/L 104  CO2 22 - 32 mmol/L 26  Calcium 8.9 - 10.3 mg/dL 8.8(L)  Total Protein 6.5 - 8.1 g/dL 6.7  Total Bilirubin 0.3 - 1.2 mg/dL 1.4(H)  Alkaline Phos 38 - 126 U/L 73  AST 15 - 41 U/L 18  ALT 17 - 63 U/L 16(L)   CBC Latest Ref Rng & Units 09/14/2017  WBC  3.8 - 10.6 K/uL 6.0  Hemoglobin 13.0 - 18.0 g/dL 14.7  Hematocrit 40.0 - 52.0 % 43.8  Platelets 150 - 440 K/uL 160    Assessment and plan- Patient is a 47 y.o. male referred for elevated IgE  Patient has a mildly elevated IgE level of 121. In one study of adults and children normal levels were ranging upto 214 ng/ml. Therefore a IgE level of 121 is not clinically significant in my opinion. Also mildly decreased IgG level is again non specific and especially in the absence of recurrent infections IVIG is not even indicated unless levels are <500  Patient does not have evidence of allergic rhinitis, asthma or dermatitis. He had mild eosinophilia noted on his colon biopsies which the comment mentions was not specific for eosinophilic states. Serum tryptase level is normal. Clinically no evidence of systemic mastocytosis either. This patient does not require a bone marrow biopsy. I do not feel that the mildly elevated IgE is related to his GI symptoms.   I will check a repeat cbc with diff and myeloma panel at this time and see him back in 2 weeks  If he has persistent GI symptoms and/or ongoing weight loss- consider CT abdomen   Thank you for this kind referral and the opportunity to participate in the care of this  Patient   Visit Diagnosis 1. Elevated IgE level     Dr. Randa Evens, MD, MPH [facility] Pager- [phone] 10/01/2017

## 2017-10-01 NOTE — Progress Notes (Signed)
Patient states he does not have any stress but upset for the visit/ prior MD didn't tell him why he was coming to the cancer center.

## 2017-10-04 ENCOUNTER — Telehealth: Payer: Self-pay

## 2017-10-04 NOTE — Telephone Encounter (Signed)
Called pt's wife to get an idea on when they would like CT scan. She said she thought we were going to wait until after allergy testing done. This remains on hold for now.

## 2017-10-05 LAB — MULTIPLE MYELOMA PANEL, SERUM
ALBUMIN SERPL ELPH-MCNC: 4.2 g/dL (ref 2.9–4.4)
ALPHA 1: 0.2 g/dL (ref 0.0–0.4)
Albumin/Glob SerPl: 2 — ABNORMAL HIGH (ref 0.7–1.7)
Alpha2 Glob SerPl Elph-Mcnc: 0.5 g/dL (ref 0.4–1.0)
B-GLOBULIN SERPL ELPH-MCNC: 0.9 g/dL (ref 0.7–1.3)
Gamma Glob SerPl Elph-Mcnc: 0.5 g/dL (ref 0.4–1.8)
Globulin, Total: 2.2 g/dL (ref 2.2–3.9)
IGG (IMMUNOGLOBIN G), SERUM: 631 mg/dL — AB (ref 700–1600)
IgA: 227 mg/dL (ref 90–386)
IgM (Immunoglobulin M), Srm: 29 mg/dL (ref 20–172)
TOTAL PROTEIN ELP: 6.4 g/dL (ref 6.0–8.5)

## 2017-10-07 ENCOUNTER — Telehealth: Payer: Self-pay | Admitting: Gastroenterology

## 2017-10-07 ENCOUNTER — Telehealth: Payer: Self-pay | Admitting: *Deleted

## 2017-10-07 NOTE — Telephone Encounter (Signed)
Wife called asking someone call her back to go over the MM Panel results and what they mean. He has a follow up appointment 3/1. Her # [phone]  Elevated IgE level   Ref Range & Units 6d ago 3wk ago  IgG (Immunoglobin G), Serum 700 - 1,600 mg/dL 631 Abnormally low   684 Abnormally low    IgA 90 - 386 mg/dL 227  248   IgM (Immunoglobulin M), Srm 20 - 172 mg/dL 29  35 CM  Total Protein ELP 6.0 - 8.5 g/dL 6.4 VC   Albumin SerPl Elph-Mcnc 2.9 - 4.4 g/dL 4.2 VC   Alpha 1 0.0 - 0.4 g/dL 0.2 VC   Alpha2 Glob SerPl Elph-Mcnc 0.4 - 1.0 g/dL 0.5 VC   B-Globulin SerPl Elph-Mcnc 0.7 - 1.3 g/dL 0.9 VC   Gamma Glob SerPl Elph-Mcnc 0.4 - 1.8 g/dL 0.5 VC   M Protein SerPl Elph-Mcnc Not Observed g/dL Not Observed VC   Globulin, Total 2.2 - 3.9 g/dL 2.2 VC   Albumin/Glob SerPl 0.7 - 1.7 2.0 Abnormally high  VC   IFE 1  Comment VC   Comment: An apparent normal immunofixation pattern.  Please Note  Comment VC   Comment: (NOTE)  Protein electrophoresis scan will follow via computer, mail, or  courier delivery.

## 2017-10-07 NOTE — Telephone Encounter (Signed)
He has a mildly low IGG level which is non specific. It is not related to present symptoms. He does not have multiple myeloma. Discuss further at next appointment. No cause for worry based on these blood tests

## 2017-10-07 NOTE — Telephone Encounter (Signed)
Call returned to wife and she was advised of doctor response. She stated they were wandering if he needed to keep appointment next week and I advised yes

## 2017-10-07 NOTE — Telephone Encounter (Signed)
The pathologist talked with Dr. Bonna Gains per Dr. Bonna Gains.

## 2017-10-07 NOTE — Telephone Encounter (Signed)
Talked to the pt today and he states he is doing well. He has not had any further episodes of N/V or diarrhea since he started the elimination diet.  He is seeing the allergist next week for allergy testing.  I talked to the GI pathologist, Dr. Nydia Bouton, who we sent his slides to.  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  He did not see any eosinophils in his esophagus or gastric biopsies.  He states the eosinophils are enough in number to be consistent with eosinophilic enteritis or colitis.  He states that there might be soft or subtle findings of chronicity, but is not convincing for chronicity.  Differential diagnosis would include IBD, drug side effect, parasitic infection.  His [facility] labs, were negative for all the parasitic antibodies that we ordered.  He did not have any chronic symptoms to point to IBD.  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  I have asked him to call me again if symptoms worsen or do not improve.  At that point we would need to start him on budesonide.  If the allergy test points to a certain food allergen, we can start reintroducing the other foods and monitor symptoms.  On his next visit, can recheck labs, and CRP to see if it is improved with the food elimination diet.

## 2017-10-08 ENCOUNTER — Telehealth: Payer: Self-pay

## 2017-10-08 ENCOUNTER — Other Ambulatory Visit: Payer: Self-pay | Admitting: *Deleted

## 2017-10-08 MED ORDER — PANTOPRAZOLE SODIUM 40 MG PO TBEC: 40.0000 mg | DELAYED_RELEASE_TABLET | Freq: Every day | ORAL | 1 refills | Status: DC

## 2017-10-08 NOTE — Telephone Encounter (Signed)
[facility] faxed lab results from 09/25/17. These were given to Dr. Bonna Gains.

## 2017-10-08 NOTE — Telephone Encounter (Signed)
Labs ordered on 09/24/2017, requisitions did not accompany pt to [place] labs when he went so they were not done.

## 2017-10-08 NOTE — Telephone Encounter (Signed)
-----   Message from Virgel Manifold, MD sent at 10/06/2017 12:19 PM EST ----- Did we get his results from his stool testing from [facility]

## 2017-10-11 ENCOUNTER — Encounter: Payer: Self-pay | Admitting: Oncology

## 2017-10-11 ENCOUNTER — Encounter: Payer: Self-pay | Admitting: Gastroenterology

## 2017-10-12 ENCOUNTER — Other Ambulatory Visit: Payer: Self-pay | Admitting: Family Medicine

## 2017-10-12 ENCOUNTER — Telehealth: Payer: Self-pay | Admitting: *Deleted

## 2017-10-12 NOTE — Telephone Encounter (Signed)
Patient himself has called asking if he really needs to to keep follow up appointment Friday 10/15/17. States the other doc told him the results were negative. Please advise if he needs to come back in

## 2017-10-12 NOTE — Telephone Encounter (Signed)
Ok. I will call him and explain he results. And will tell Verdis Frederickson to cancel his appointment

## 2017-10-14 ENCOUNTER — Encounter: Payer: Self-pay | Admitting: Gastroenterology

## 2017-10-15 ENCOUNTER — Ambulatory Visit: Payer: BC Managed Care – PPO | Admitting: Gastroenterology

## 2017-10-15 ENCOUNTER — Ambulatory Visit: Payer: BC Managed Care – PPO | Admitting: Oncology

## 2017-10-18 ENCOUNTER — Ambulatory Visit: Payer: BC Managed Care – PPO | Admitting: Gastroenterology

## 2017-10-18 NOTE — Addendum Note (Signed)
Addended by: Earl Lagos on: 10/18/2017 04:00 PM   Modules accepted: Orders

## 2017-10-18 NOTE — Addendum Note (Signed)
Addended by: Earl Lagos on: 10/18/2017 03:59 PM   Modules accepted: Orders

## 2017-10-19 ENCOUNTER — Other Ambulatory Visit: Payer: Self-pay

## 2017-10-19 ENCOUNTER — Encounter: Payer: Self-pay | Admitting: Gastroenterology

## 2017-10-19 ENCOUNTER — Ambulatory Visit: Payer: BC Managed Care – PPO | Admitting: Gastroenterology

## 2017-10-19 ENCOUNTER — Other Ambulatory Visit
Admission: RE | Admit: 2017-10-19 | Discharge: 2017-10-19 | Disposition: A | Discharge status: Home / Self Care | Payer: BC Managed Care – PPO | Source: Ambulatory Visit | Attending: Gastroenterology | Admitting: Gastroenterology

## 2017-10-19 VITALS — BP 129/83 | HR 111 | Temp 97.4°F | Ht 69.0 in | Wt 165.2 lb

## 2017-10-19 DIAGNOSIS — R748 Abnormal levels of other serum enzymes: Secondary | ICD-10-CM | POA: Insufficient documentation

## 2017-10-19 DIAGNOSIS — K5281 Eosinophilic gastritis or gastroenteritis: Secondary | ICD-10-CM

## 2017-10-19 DIAGNOSIS — R634 Abnormal weight loss: Secondary | ICD-10-CM | POA: Insufficient documentation

## 2017-10-19 DIAGNOSIS — E538 Deficiency of other specified B group vitamins: Secondary | ICD-10-CM | POA: Insufficient documentation

## 2017-10-19 LAB — CBC
HCT: 54.5 % — ABNORMAL HIGH (ref 40.0–52.0)
Hemoglobin: 18 g/dL (ref 13.0–18.0)
MCH: 27.5 pg (ref 26.0–34.0)
MCHC: 33.1 g/dL (ref 32.0–36.0)
MCV: 83.1 fL (ref 80.0–100.0)
Platelets: 234 10*3/uL (ref 150–440)
RBC: 6.55 MIL/uL — ABNORMAL HIGH (ref 4.40–5.90)
RDW: 13.5 % (ref 11.5–14.5)
WBC: 8.4 10*3/uL (ref 3.8–10.6)

## 2017-10-19 LAB — COMPREHENSIVE METABOLIC PANEL
ALBUMIN: 5.2 g/dL — AB (ref 3.5–5.0)
ALK PHOS: 89 U/L (ref 38–126)
ALT: 19 U/L (ref 17–63)
AST: 18 U/L (ref 15–41)
Anion gap: 10 (ref 5–15)
BUN: 34 mg/dL — ABNORMAL HIGH (ref 6–20)
CO2: 21 mmol/L — AB (ref 22–32)
Calcium: 9.5 mg/dL (ref 8.9–10.3)
Chloride: 106 mmol/L (ref 101–111)
Creatinine, Ser: 1.45 mg/dL — ABNORMAL HIGH (ref 0.61–1.24)
GFR calc Af Amer: >60 mL/min (ref >60)
GFR calc non Af Amer: 56 mL/min — ABNORMAL LOW (ref >60)
GLUCOSE: 93 mg/dL (ref 65–99)
Potassium: 4.3 mmol/L (ref 3.5–5.1)
SODIUM: 137 mmol/L (ref 135–145)
TOTAL PROTEIN: 8 g/dL (ref 6.5–8.1)
Total Bilirubin: 1.5 mg/dL — ABNORMAL HIGH (ref 0.3–1.2)

## 2017-10-19 LAB — FOLATE: Folate: 7.2 ng/mL

## 2017-10-19 MED ORDER — BUDESONIDE 3 MG PO CPEP: 9.0000 mg | ORAL_CAPSULE | Freq: Every day | ORAL | 1 refills | Status: DC

## 2017-10-19 NOTE — Patient Instructions (Signed)
4 wk f/u

## 2017-10-20 LAB — INTRINSIC FACTOR ANTIBODIES: Intrinsic Factor: 1 AU/mL (ref 0.0–1.1)

## 2017-10-20 LAB — C-REACTIVE PROTEIN

## 2017-10-20 LAB — ALPHA-1-ANTITRYPSIN: A1 ANTITRYPSIN SER: 157 mg/dL (ref 90–200)

## 2017-10-20 LAB — HOMOCYSTEINE: HOMOCYSTEINE-NORM: 17.5 umol/L — AB (ref 0.0–15.0)

## 2017-10-20 NOTE — Progress Notes (Signed)
[name], MD [address]  [place]  [place], [place] [address]  Main: [phone]  Fax: [phone]   Primary Care Physician: Jinny Sanders, MD  Primary Gastroenterologist:  Dr. [name]  Chief Complaint  Patient presents with  . Follow-up    eosinophilic enteritis and colitis    HPI: Kyle Johnson is a 47 y.o. male here for follow-up of eosinophilic enteritis and colitis.  Patient initially seen in clinic on July 30, 2017 due to episodic nausea, vomiting, diarrhea, and 20 pound weight loss.  Since then patient has undergone workup, including EGD and colonoscopy.  See procedure report and pathology report below for findings.  At this point has been following a 6 food elimination diet since about September 14, 2017.  This has improved his symptoms somewhat.  However, he still has episodic diarrhea.  Yesterday for example, he reports having 4-6 loose bowel movements during the day.  Without any blood.  No nausea or vomiting time.  These episodes are now occurring every 3-4 weeks, were previously occurring every 2 weeks. He has undergone allergy testing with [facility], and he did not test positive for any food allergens.  He has seen hematology/oncology due to mildly low IgG, and slightly elevated IgE, and they have completed their workup, and did not find any significant abnormalities.  "See colonoscopy and EGD report from August 20, 2017.  Colonic and terminal ileum mucosa was normal and random biopsies were taken.  See pathology report that showed mucosal eosinophilia in terminal ileum and colonic biopsies.  Mild subepithelial collagen deposition was also seen on the colon biopsies.  Sessile serrated adenoma and hyperplastic polyps were removed. esophageal biopsies did not show any eosinophils."  Pathology slides were sent to, Dr. Nydia Bouton, who is a specific GI pathologist.  See the scanned report under media for his findings.  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  He did not see any eosinophils in his esophagus or gastric biopsies.  He states the eosinophils are enough in number to be consistent with eosinophilic enteritis or colitis.  He states that there might be soft or subtle findings of chronicity, but is not convincing for chronicity.  Differential diagnosis would include IBD, drug side effect, parasitic infection.  His [facility] labs, were negative for all the parasitic antibodies that we ordered.  He did not have any chronic symptoms to point to IBD."  Current Outpatient Medications  Medication Sig Dispense Refill  . amLODipine (NORVASC) 5 MG tablet TAKE 1 TABLET (5 MG TOTAL) BY MOUTH DAILY. 90 tablet 0  . pantoprazole (PROTONIX) 40 MG tablet Take 1 tablet (40 mg total) by mouth daily. 90 tablet 1  . vitamin B-12 (CYANOCOBALAMIN) 1000 MCG tablet Take 1 tablet (1,000 mcg total) by mouth daily. 30 tablet 1  . budesonide (ENTOCORT EC) 3 MG 24 hr capsule Take 3 capsules (9 mg total) by mouth daily. 90 capsule 1   No current facility-administered medications for this visit.     Allergies as of 10/19/2017 - Review Complete 10/19/2017  Allergen Reaction Noted  . Aspirin    . Ibuprofen Other (See Comments) 09/23/2016  . Milk-related compounds    . Penicillins    . Propoxyphene hcl    . Tetanus toxoids  11/11/2016    ROS:  General: Negative for anorexia, weight loss, fever, chills, fatigue, weakness. ENT: Negative for hoarseness, difficulty swallowing ,  nasal congestion. CV: Negative for chest pain, angina, palpitations, dyspnea on exertion, peripheral edema.  Respiratory: Negative for dyspnea at rest, dyspnea on exertion, cough, sputum, wheezing.  GI: See history of present illness. GU:  Negative for dysuria, hematuria, urinary incontinence, urinary frequency, nocturnal urination.  Endo: Negative for unusual weight change.      Physical Examination:   BP 129/83   Pulse (!) 111   Temp (!) 97.4 F (36.3 C) (Oral)   Ht 5\' 9"  (1.753 m)   Wt 165 lb 3.2 oz (74.9 kg)   BMI 24.40 kg/m   General: Well-nourished, well-developed in no acute distress.  Eyes: No icterus. Conjunctivae pink. Mouth: Oropharyngeal mucosa moist and pink , no lesions erythema or exudate. Neck: Supple, Trachea midline Abdomen: Bowel sounds are normal, nontender, nondistended, no hepatosplenomegaly or masses, no abdominal bruits or hernia , no rebound or guarding.   Extremities: No lower extremity edema. No clubbing or deformities. Neuro: Alert and oriented x 3.  Grossly intact. Skin: Warm and dry, no jaundice.   Psych: Alert and cooperative, normal mood and affect.   Labs: CMP     Component Value Date/Time   NA 137 10/19/2017 1717   NA 138 07/13/2013 0041   K 4.3 10/19/2017 1717   K 3.9 07/13/2013 0041   CL 106 10/19/2017 1717   CL 103 07/13/2013 0041   CO2 21 (L) 10/19/2017 1717   CO2 27 07/13/2013 0041   GLUCOSE 93 10/19/2017 1717   GLUCOSE 142 (H) 07/13/2013 0041   BUN 34 (H) 10/19/2017 1717   BUN 15 07/13/2013 0041   CREATININE 1.45 (H) 10/19/2017 1717   CREATININE 0.98 07/13/2013 0041   CALCIUM 9.5 10/19/2017 1717   CALCIUM 9.4 07/13/2013 0041   PROT 8.0 10/19/2017 1717   PROT 7.9 07/13/2013 0041   ALBUMIN 5.2 (H) 10/19/2017 1717   ALBUMIN 4.4 07/13/2013 0041   AST 18 10/19/2017 1717   AST 27 07/13/2013 0041   ALT 19 10/19/2017 1717   ALT 48 07/13/2013 0041   ALKPHOS 89 10/19/2017 1717   ALKPHOS 95 07/13/2013 0041   BILITOT 1.5 (H) 10/19/2017 1717   BILITOT 0.6 07/13/2013 0041   GFRNONAA 56 (L) 10/19/2017 1717   GFRNONAA >60 07/13/2013 0041   GFRAA >60 10/19/2017 1717   GFRAA >60 07/13/2013 0041   Lab Results  Component Value Date   WBC 8.4 10/19/2017   HGB 18.0 10/19/2017   HCT 54.5 (H) 10/19/2017   MCV 83.1 10/19/2017   PLT 234 10/19/2017    Imaging Studies: No results found.  Assessment and  Plan:   Kyle Johnson is a 47 y.o. 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  We will start treatment with budesonide first.  His stomach, and esophageal biopsies did not reveal any  Eosinophils  Since eosinophils were found on his terminal ileum and colon biopsies, and since budesonide activates in the terminal ileum, using budesonide initially can lead to treatment of eosinophilic enteritis with less side effects than using prednisone.  We will begin treatment with 9 mg/day of budesonide for 2 months, if this needs remission, will decrease it to 6 mg a day for another 1-2 months, and then taper off.  We will obtain labs today, patient has been asked to get this  done before starting the budesonide.  It would be useful to see if his CRP has declined from what it was before (around 4), after he has been on the 6 food elimination diet.  The adverse effects of steroids were discussed with him in detail, and he verbalized understanding.  Budesonide will have lesser side effects than prednisone would, And this was discussed with him as well.  If his CRP is still elevated, it would be useful to obtain a CT scan to evaluate his small bowel.  We had ordered the CT scan before, and he declined having it done at that time.  He is willing to have this done, labs indicate the test.  Between his intermittent episodes, that occur every 3-4 weeks at this point, his bowel movements are normal.  He has no abdominal pain during that time.  And reports 1-2 regular bowel movements during that time without blood.  Therefore, the symptoms would not indicate IBD, as that usually causes a chronic or daily loose stools, with intermittent blood streaks, and not these episodic  episodes that patient is describing.  In addition, his biopsies did not indicate IBD either, and extensive discussion with the GI pathologist as mentioned above, indicated that findings are most consistent with eosinophilic enteritis and colitis.  Dr [name]

## 2017-10-22 ENCOUNTER — Telehealth: Payer: Self-pay

## 2017-10-22 DIAGNOSIS — R748 Abnormal levels of other serum enzymes: Secondary | ICD-10-CM

## 2017-10-22 DIAGNOSIS — R634 Abnormal weight loss: Secondary | ICD-10-CM

## 2017-10-22 NOTE — Progress Notes (Signed)
Pt. Notified that his Inflammatory markers are now normal. He was on the food elimination diet, which likely helped with the CRP normalize. He was prescribed budesonide on last clinic visit and is on it. Pt notified to hydrate well as his Createnine is elevated likely due to his diarrhea and dehydration. Will repeat labs in 1-2 weeks.

## 2017-10-22 NOTE — Telephone Encounter (Signed)
LMTCO. (Dr. Bonna Gains states labs are better. Inflammatory markers good. Kidney function is a little higher. Would like for pt to repeat CMP in 1-2 wks.)

## 2017-10-25 LAB — METHYLMALONIC ACID(MMA), RND URINE
CREATININE(CRT), U: 3.3 g/L — AB (ref 0.30–3.00)
MMA - Normalized: 0.7 umol/mmol cr (ref 0.3–2.8)
Methylmalonic Acid, Ur: 20.7 umol/L (ref 1.6–29.7)

## 2017-10-27 ENCOUNTER — Other Ambulatory Visit: Payer: Self-pay | Admitting: Family Medicine

## 2017-10-28 ENCOUNTER — Other Ambulatory Visit: Payer: Self-pay | Admitting: *Deleted

## 2017-11-05 ENCOUNTER — Other Ambulatory Visit
Admission: RE | Admit: 2017-11-05 | Discharge: 2017-11-05 | Disposition: A | Discharge status: Home / Self Care | Payer: BC Managed Care – PPO | Source: Ambulatory Visit | Attending: Gastroenterology | Admitting: Gastroenterology

## 2017-11-05 DIAGNOSIS — R634 Abnormal weight loss: Secondary | ICD-10-CM | POA: Insufficient documentation

## 2017-11-05 DIAGNOSIS — R748 Abnormal levels of other serum enzymes: Secondary | ICD-10-CM | POA: Diagnosis present

## 2017-11-05 LAB — COMPREHENSIVE METABOLIC PANEL
ALT: 16 U/L — AB (ref 17–63)
AST: 19 U/L (ref 15–41)
Albumin: 4.6 g/dL (ref 3.5–5.0)
Alkaline Phosphatase: 68 U/L (ref 38–126)
Anion gap: 9 (ref 5–15)
BUN: 18 mg/dL (ref 6–20)
CHLORIDE: 105 mmol/L (ref 101–111)
CO2: 27 mmol/L (ref 22–32)
CREATININE: 1.23 mg/dL (ref 0.61–1.24)
Calcium: 9.1 mg/dL (ref 8.9–10.3)
GFR calc Af Amer: >60 mL/min (ref >60)
GFR calc non Af Amer: >60 mL/min (ref >60)
GLUCOSE: 99 mg/dL (ref 65–99)
Potassium: 3.4 mmol/L — ABNORMAL LOW (ref 3.5–5.1)
SODIUM: 141 mmol/L (ref 135–145)
Total Bilirubin: 0.9 mg/dL (ref 0.3–1.2)
Total Protein: 6.9 g/dL (ref 6.5–8.1)

## 2017-11-10 ENCOUNTER — Other Ambulatory Visit: Payer: Self-pay | Admitting: Gastroenterology

## 2017-11-10 DIAGNOSIS — K5281 Eosinophilic gastritis or gastroenteritis: Secondary | ICD-10-CM

## 2017-11-24 ENCOUNTER — Other Ambulatory Visit
Admission: RE | Admit: 2017-11-24 | Discharge: 2017-11-24 | Disposition: A | Discharge status: Home / Self Care | Payer: BC Managed Care – PPO | Source: Ambulatory Visit | Attending: Gastroenterology | Admitting: Gastroenterology

## 2017-11-24 ENCOUNTER — Encounter: Payer: Self-pay | Admitting: Gastroenterology

## 2017-11-24 ENCOUNTER — Other Ambulatory Visit: Payer: Self-pay

## 2017-11-24 ENCOUNTER — Ambulatory Visit (INDEPENDENT_AMBULATORY_CARE_PROVIDER_SITE_OTHER): Payer: BC Managed Care – PPO | Admitting: Gastroenterology

## 2017-11-24 VITALS — BP 126/73 | HR 67 | Temp 97.6°F | Ht 69.0 in | Wt 176.0 lb

## 2017-11-24 DIAGNOSIS — K5282 Eosinophilic colitis: Secondary | ICD-10-CM | POA: Diagnosis not present

## 2017-11-24 DIAGNOSIS — E876 Hypokalemia: Secondary | ICD-10-CM

## 2017-11-24 LAB — CBC WITH DIFFERENTIAL/PLATELET
BASOS ABS: 0.1 10*3/uL (ref 0–0.1)
BASOS PCT: 1 %
EOS ABS: 0.1 10*3/uL (ref 0–0.7)
EOS PCT: 1 %
HCT: 42.4 % (ref 40.0–52.0)
Hemoglobin: 14.4 g/dL (ref 13.0–18.0)
LYMPHS PCT: 15 %
Lymphs Abs: 1.1 10*3/uL (ref 1.0–3.6)
MCH: 29 pg (ref 26.0–34.0)
MCHC: 34 g/dL (ref 32.0–36.0)
MCV: 85.2 fL (ref 80.0–100.0)
Monocytes Absolute: 0.4 10*3/uL (ref 0.2–1.0)
Monocytes Relative: 6 %
Neutro Abs: 5.5 10*3/uL (ref 1.4–6.5)
Neutrophils Relative %: 77 %
PLATELETS: 180 10*3/uL (ref 150–440)
RBC: 4.97 MIL/uL (ref 4.40–5.90)
RDW: 13.7 % (ref 11.5–14.5)
WBC: 7.2 10*3/uL (ref 3.8–10.6)

## 2017-11-24 LAB — COMPREHENSIVE METABOLIC PANEL
ALT: 14 U/L — ABNORMAL LOW (ref 17–63)
AST: 14 U/L — AB (ref 15–41)
Albumin: 4.4 g/dL (ref 3.5–5.0)
Alkaline Phosphatase: 60 U/L (ref 38–126)
Anion gap: 6 (ref 5–15)
BILIRUBIN TOTAL: 0.9 mg/dL (ref 0.3–1.2)
BUN: 11 mg/dL (ref 6–20)
CO2: 29 mmol/L (ref 22–32)
Calcium: 8.9 mg/dL (ref 8.9–10.3)
Chloride: 103 mmol/L (ref 101–111)
Creatinine, Ser: 0.94 mg/dL (ref 0.61–1.24)
GFR calc Af Amer: >60 mL/min (ref >60)
Glucose, Bld: 93 mg/dL (ref 65–99)
POTASSIUM: 3.7 mmol/L (ref 3.5–5.1)
Sodium: 138 mmol/L (ref 135–145)
TOTAL PROTEIN: 6.7 g/dL (ref 6.5–8.1)

## 2017-11-24 MED ORDER — VITAMIN B-12 1000 MCG PO TABS: 1000.0000 ug | ORAL_TABLET | Freq: Every day | ORAL | 2 refills | Status: DC

## 2017-11-24 NOTE — Progress Notes (Signed)
Kyle Antigua, MD [address]  [place]  [place], [place] [address]  Main: [phone]  Fax: [phone]   Primary Care Physician: Jinny Sanders, MD  Primary Gastroenterologist:  Kyle Johnson  Chief Complaint  Patient presents with  . Follow-up    eosinophilic enteritis and colitis.     HPI: Kyle Johnson is a 47 y.o. maleHere for follow-up of eosinophilic enterocolitis.  Interval history: Patient was started on budesonide 9 mg/day on October 19, 2017 when he was last seen.  Plan was to continue this dose for 2 months, and then if patient clinical remission, decrease it to 6 mg a day for another 1-2 months and then taper off.  Patient is overall improved, and that his previous episodes of nausea vomiting diarrhea are much less frequent.  He only had one episode since seeing [place] last on October 19, 2017.  And this episode only consisted of 10 loose bowel movements on one day.  He does not have any nausea or vomiting with this.  Previously used to have 1-2 days of continuous nausea vomiting and diarrhea.  No fever or chills.  Is also starting to eat more of a regular diet and tolerating it well.  However, does complain of days of decreased energy.  His CRP was 4.2 on 09/14/2017, and is now normal on repeat labs on 10/19/2017  6 food elimination diet was not controlling his symptoms previously, and budesonide was started.  Previous history: Patient initially seen in clinic on July 30, 2017 due to episodic nausea, vomiting, diarrhea, and 20 pound weight loss.  Since then patient has undergone workup, including EGD and colonoscopy.  See procedure report and pathology report below for findings.  See colonoscopy and EGD report from August 20, 2017. Colonic and terminal ileum mucosa was normal and random biopsies were taken. See pathology report that showed mucosal eosinophilia interminal ileum and colonic biopsies. Mild subepithelial collagen deposition was also  seen on the colon biopsies. Sessile serrated adenoma and hyperplastic polyps wereremoved.esophageal biopsies did not show any eosinophils."  Pathology slides were sent to, Dr. Nydia Bouton, who is a specific GI pathologist.  See the scanned report under media for his findings.  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 He did not see any eosinophils in his esophagus or gastric biopsies. He states the eosinophils are enough in number to be consistent with eosinophilic enteritis or colitis. He states that there might be soft or subtle findings of chronicity, but is not convincing for chronicity. Differential diagnosis would include IBD, drug side effect, parasitic infection.  His [facility] labs, were negative for all the parasitic antibodies that we ordered. He did not have any chronic symptoms to point to IBD."  He has undergone allergy testing with [facility], and he did not test positive for any food allergens.  He has seen hematology/oncology due to mildly low IgG, and slightly elevated IgE, and they have completed their workup, and did not find any significant abnormalities.    Current Outpatient Medications  Medication Sig Dispense Refill  . amLODipine (NORVASC) 5 MG tablet TAKE 1 TABLET (5 MG TOTAL) BY MOUTH DAILY. 90 tablet 0  . budesonide (ENTOCORT EC) 3 MG 24 hr capsule TAKE 3 CAPSULES (9 MG TOTAL) BY MOUTH DAILY. 90 capsule 0  . pantoprazole (PROTONIX) 40 MG tablet Take 1 tablet (40 mg total) by mouth daily. 90 tablet 1  .  vitamin B-12 (CYANOCOBALAMIN) 1000 MCG tablet Take 1 tablet (1,000 mcg total) by mouth daily. 30 tablet 2   No current facility-administered medications for this visit.     Allergies as of 11/24/2017 - Review Complete 11/24/2017  Allergen Reaction Noted  . Aspirin    . Ibuprofen Other (See Comments) 09/23/2016  . Milk-related compounds    . Penicillins    .  Propoxyphene hcl    . Tetanus toxoids  11/11/2016    ROS:  General: Negative for anorexia, weight loss, fever, chills, fatigue, weakness. ENT: Negative for hoarseness, difficulty swallowing , nasal congestion. CV: Negative for chest pain, angina, palpitations, dyspnea on exertion, peripheral edema.  Respiratory: Negative for dyspnea at rest, dyspnea on exertion, cough, sputum, wheezing.  GI: See history of present illness. GU:  Negative for dysuria, hematuria, urinary incontinence, urinary frequency, nocturnal urination.  Endo: Negative for unusual weight change.    Physical Examination:   BP 126/73   Pulse 67   Temp 97.6 F (36.4 C) (Oral)   Ht 5\' 9"  (1.753 m)   Wt 176 lb (79.8 kg)   BMI 25.99 kg/m   General: Well-nourished, well-developed in no acute distress.  Eyes: No icterus. Conjunctivae pink. Mouth: Oropharyngeal mucosa moist and pink , no lesions erythema or exudate. Neck: Supple, Trachea midline Abdomen: Bowel sounds are normal, nontender, nondistended, no hepatosplenomegaly or masses, no abdominal bruits or hernia , no rebound or guarding.   Extremities: No lower extremity edema. No clubbing or deformities. Neuro: Alert and oriented x 3.  Grossly intact. Skin: Warm and dry, no jaundice.   Psych: Alert and cooperative, normal mood and affect.   Labs: CMP     Component Value Date/Time   NA 141 11/05/2017 1249   NA 138 07/13/2013 0041   K 3.4 (L) 11/05/2017 1249   K 3.9 07/13/2013 0041   CL 105 11/05/2017 1249   CL 103 07/13/2013 0041   CO2 27 11/05/2017 1249   CO2 27 07/13/2013 0041   GLUCOSE 99 11/05/2017 1249   GLUCOSE 142 (H) 07/13/2013 0041   BUN 18 11/05/2017 1249   BUN 15 07/13/2013 0041   CREATININE 1.23 11/05/2017 1249   CREATININE 0.98 07/13/2013 0041   CALCIUM 9.1 11/05/2017 1249   CALCIUM 9.4 07/13/2013 0041   PROT 6.9 11/05/2017 1249   PROT 7.9 07/13/2013 0041   ALBUMIN 4.6 11/05/2017 1249   ALBUMIN 4.4 07/13/2013 0041   AST 19  11/05/2017 1249   AST 27 07/13/2013 0041   ALT 16 (L) 11/05/2017 1249   ALT 48 07/13/2013 0041   ALKPHOS 68 11/05/2017 1249   ALKPHOS 95 07/13/2013 0041   BILITOT 0.9 11/05/2017 1249   BILITOT 0.6 07/13/2013 0041   GFRNONAA >60 11/05/2017 1249   GFRNONAA >60 07/13/2013 0041   GFRAA >60 11/05/2017 1249   GFRAA >60 07/13/2013 0041   Lab Results  Component Value Date   WBC 8.4 10/19/2017   HGB 18.0 10/19/2017   HCT 54.5 (H) 10/19/2017   MCV 83.1 10/19/2017   PLT 234 10/19/2017    Imaging Studies: No results found.  Assessment and Plan:   Kyle Johnson is a 47 y.o. y/o male here for follow-up of eosinophilic enterocolitis  Patient has shown some improvement with budesonide 9 mg/day which was started on October 29, 2017 Has only had one episode of symptoms 2 weeks ago, which consisted of 10 loose bowel movements in 1 day.  No nausea vomiting during that episode, and he used to  have that previously, prior to initiating budesonide.  We will continue budesonide at 9 mg/day, and follow-up with patient again in 1 month.  This would be a total of 2 months of 9 mg/day dosing Can decrease to 6 mg/day after that depending on symptoms  We will repeat labs today, given that March 22 labs showed hypokalemia, and patient stated that he had diarrhea around then.  Patient works at [employer] obtain second opinion regarding eosinophilic enterocolitis at [facility] at this time as well  Discontinue PPI as no heartburn  Patient is now eating a better diet, and more caloric intake He would like to start protein shakes I have asked him to start slowly to see how he tolerates it before starting to have full glasses every day     Dr Kyle Johnson

## 2017-11-24 NOTE — Patient Instructions (Signed)
FU in 1 month

## 2017-11-24 NOTE — Addendum Note (Signed)
Addended by: Earl Lagos on: 11/24/2017 04:14 PM   Modules accepted: Orders

## 2017-11-25 NOTE — Addendum Note (Signed)
Addended by: Earl Lagos on: 11/25/2017 12:52 PM   Modules accepted: Orders

## 2017-11-26 NOTE — Addendum Note (Signed)
Addended by: Earl Lagos on: 11/26/2017 10:33 AM   Modules accepted: Orders

## 2017-12-10 ENCOUNTER — Other Ambulatory Visit: Payer: Self-pay | Admitting: Gastroenterology

## 2017-12-10 DIAGNOSIS — K5281 Eosinophilic gastritis or gastroenteritis: Secondary | ICD-10-CM

## 2018-01-09 ENCOUNTER — Other Ambulatory Visit: Payer: Self-pay | Admitting: Gastroenterology

## 2018-01-09 DIAGNOSIS — K5281 Eosinophilic gastritis or gastroenteritis: Secondary | ICD-10-CM

## 2018-01-19 ENCOUNTER — Ambulatory Visit: Payer: BC Managed Care – PPO | Admitting: Gastroenterology

## 2018-01-26 ENCOUNTER — Other Ambulatory Visit: Payer: Self-pay | Admitting: Family Medicine

## 2018-02-08 ENCOUNTER — Other Ambulatory Visit: Payer: Self-pay | Admitting: Gastroenterology

## 2018-02-08 DIAGNOSIS — K5281 Eosinophilic gastritis or gastroenteritis: Secondary | ICD-10-CM

## 2018-02-14 ENCOUNTER — Other Ambulatory Visit: Payer: Self-pay | Admitting: Gastroenterology

## 2018-02-14 DIAGNOSIS — K5281 Eosinophilic gastritis or gastroenteritis: Secondary | ICD-10-CM

## 2018-02-28 ENCOUNTER — Encounter: Payer: Self-pay | Admitting: Gastroenterology

## 2018-02-28 ENCOUNTER — Telehealth: Payer: Self-pay | Admitting: Gastroenterology

## 2018-02-28 NOTE — Telephone Encounter (Signed)
Left message that canceling there appt at [facility] [place] is something they would need to do. Rescheduling this would be the best thing.

## 2018-02-28 NOTE — Telephone Encounter (Signed)
Pt wife  Left vm to find out about [facility] referral apt she states her husband is scheduled  At [facility] for a second opinion Thursday and they are going out of town Wednesday they need to cancel that apt please call [phone] or [phone]

## 2018-03-10 ENCOUNTER — Other Ambulatory Visit: Payer: Self-pay | Admitting: Gastroenterology

## 2018-03-10 DIAGNOSIS — K5281 Eosinophilic gastritis or gastroenteritis: Secondary | ICD-10-CM

## 2018-04-01 ENCOUNTER — Other Ambulatory Visit (INDEPENDENT_AMBULATORY_CARE_PROVIDER_SITE_OTHER): Payer: BC Managed Care – PPO

## 2018-04-01 ENCOUNTER — Telehealth: Payer: Self-pay | Admitting: Family Medicine

## 2018-04-01 DIAGNOSIS — E7800 Pure hypercholesterolemia, unspecified: Secondary | ICD-10-CM | POA: Diagnosis not present

## 2018-04-01 LAB — COMPREHENSIVE METABOLIC PANEL
ALK PHOS: 59 U/L (ref 39–117)
ALT: 14 U/L (ref 0–53)
AST: 11 U/L (ref 0–37)
Albumin: 4.5 g/dL (ref 3.5–5.2)
BUN: 20 mg/dL (ref 6–23)
CO2: 32 mEq/L (ref 19–32)
CREATININE: 1.1 mg/dL (ref 0.40–1.50)
Calcium: 9.5 mg/dL (ref 8.4–10.5)
Chloride: 104 mEq/L (ref 96–112)
GFR: 75.81 mL/min (ref >60.00)
GLUCOSE: 88 mg/dL (ref 70–99)
POTASSIUM: 3.9 meq/L (ref 3.5–5.1)
SODIUM: 141 meq/L (ref 135–145)
TOTAL PROTEIN: 6.2 g/dL (ref 6.0–8.3)
Total Bilirubin: 1.1 mg/dL (ref 0.2–1.2)

## 2018-04-01 LAB — LIPID PANEL
Cholesterol: 127 mg/dL (ref 0–200)
HDL: 50.8 mg/dL (ref >39.00)
LDL CALC: 59 mg/dL (ref 0–99)
NonHDL: 75.75
Total CHOL/HDL Ratio: 2
Triglycerides: 85 mg/dL (ref 0.0–149.0)
VLDL: 17 mg/dL (ref 0.0–40.0)

## 2018-04-01 NOTE — Telephone Encounter (Signed)
-----   Message from Ellamae Sia sent at 03/22/2018  9:26 AM EDT ----- Regarding: Lab orders for Friday, 5.16.19 Patient is scheduled for CPX labs, please order future labs, Thanks , Karna Christmas

## 2018-04-03 ENCOUNTER — Other Ambulatory Visit: Payer: Self-pay | Admitting: Gastroenterology

## 2018-04-03 DIAGNOSIS — K5281 Eosinophilic gastritis or gastroenteritis: Secondary | ICD-10-CM

## 2018-04-08 ENCOUNTER — Ambulatory Visit (INDEPENDENT_AMBULATORY_CARE_PROVIDER_SITE_OTHER): Payer: BC Managed Care – PPO | Admitting: Family Medicine

## 2018-04-08 ENCOUNTER — Encounter: Payer: Self-pay | Admitting: Family Medicine

## 2018-04-08 VITALS — BP 130/80 | HR 72 | Temp 98.2°F | Ht 68.5 in | Wt 179.8 lb

## 2018-04-08 DIAGNOSIS — Z0000 Encounter for general adult medical examination without abnormal findings: Secondary | ICD-10-CM | POA: Diagnosis not present

## 2018-04-08 DIAGNOSIS — E7800 Pure hypercholesterolemia, unspecified: Secondary | ICD-10-CM | POA: Diagnosis not present

## 2018-04-08 DIAGNOSIS — K5282 Eosinophilic colitis: Secondary | ICD-10-CM

## 2018-04-08 DIAGNOSIS — K5281 Eosinophilic gastritis or gastroenteritis: Secondary | ICD-10-CM | POA: Diagnosis not present

## 2018-04-08 DIAGNOSIS — I10 Essential (primary) hypertension: Secondary | ICD-10-CM

## 2018-04-08 NOTE — Progress Notes (Addendum)
Subjective:    Patient ID: Kyle Johnson, male    DOB: [birth year], 47 y.o.   MRN: [id number]  HPI  The patient is here for annual wellness exam and preventative care.    In last few months he has been followed by GI Dr. 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  S/P EGD and colnoscopy 08/2017: Colonic and terminal ileum mucosa was normal and random biopsies were taken. See pathology report that showed mucosal eosinophilia interminal ileum and colonic biopsies. Mild subepithelial collagen deposition was also seen on the colon biopsies. Sessile serrated adenoma and hyperplastic polyps wereremoved.esophageal biopsies did not show any eosinophils. He has undergone allergy testing with [facility], and he did not test positive for any food allergens.  He has seen hematology/oncology due to mildly low IgG, and slightly elevated IgE, and they have completed their workup, and did not find any significant abnormalities. Wt Readings from Last 3 Encounters:  04/08/18 179 lb 12 oz (81.5 kg)  11/24/17 176 lb (79.8 kg)  10/19/17 165 lb 3.2 oz (74.9 kg)     Started on budesonide taper x 4 months in 10/2017.   He is some better but he cannot eat what ever he wants .Marland Kitchen He still restricts his diet (no citris,  nothing acidic)  He is still fatigued some time. He still occ has sour Burps.. But no further emesis, only  Short term episodes of diarrhea now  referred for second opinion at [facility]   He is significantly worried about feeling bad again.Marland Kitchen just does not feel great day to day.  Hypertension: Good control on amlodipine. Using medication without problems or lightheadedness:  none Chest pain with exertion:none Edema:none Short of breath: none Average home BPs: Other issues:   Exericse: active at work Diet: see above  Eosinophilic colitis: Followed  By Gertie Fey Dr. Chuck Hint  Social History /Family  History/Past Medical History reviewed in detail and updated in EMR if needed. Blood pressure 130/80, pulse 72, temperature 98.2 F (36.8 C), temperature source Oral, height 5' 8.5" (1.74 m), weight 179 lb 12 oz (81.5 kg).   Review of Systems  Constitutional: Negative for fatigue and fever.  HENT: Negative for ear pain.   Eyes: Negative for pain.  Respiratory: Negative for cough and shortness of breath.   Cardiovascular: Negative for chest pain, palpitations and leg swelling.  Gastrointestinal: Negative for abdominal pain.  Genitourinary: Negative for dysuria.  Musculoskeletal: Negative for arthralgias.  Neurological: Negative for syncope, light-headedness and headaches.  Psychiatric/Behavioral: Negative for dysphoric mood.       Objective:   Physical Exam  Constitutional: He appears well-developed and well-nourished.  Non-toxic appearance. He does not appear ill. No distress.  HENT:  Head: Normocephalic and atraumatic.  Right Ear: Hearing, tympanic membrane, external ear and ear canal normal.  Left Ear: Hearing, tympanic membrane, external ear and ear canal normal.  Nose: Nose normal.  Mouth/Throat: Uvula is midline, oropharynx is clear and moist and mucous membranes are normal.  Eyes: Pupils are equal, round, and reactive to light. Conjunctivae, EOM and lids are normal. Lids are everted and swept, no foreign bodies found.  Neck: Trachea normal, normal range of motion and phonation normal. Neck supple. Carotid bruit is not present. No thyroid mass and no thyromegaly present.  Cardiovascular: Normal rate, regular rhythm, S1 normal, S2 normal, intact distal pulses and normal pulses. Exam reveals no gallop.  No murmur heard. Pulmonary/Chest: Breath sounds normal.  He has no wheezes. He has no rhonchi. He has no rales.  Abdominal: Soft. Normal appearance and bowel sounds are normal. There is no hepatosplenomegaly. There is no tenderness. There is no rebound, no guarding and no CVA  tenderness. No hernia.  Lymphadenopathy:    He has no cervical adenopathy.  Neurological: He is alert. He has normal strength and normal reflexes. No cranial nerve deficit or sensory deficit. Gait normal.  Skin: Skin is warm, dry and intact. No rash noted.  Psychiatric: He has a normal mood and affect. His speech is normal and behavior is normal. Judgment normal.          Assessment & Plan:  The patient's preventative maintenance and recommended screening tests for an annual wellness exam were reviewed in full today. Brought up to date unless services declined.  Counselled on the importance of diet, exercise, and its role in overall health and mortality. The patient's FH and SH was reviewed, including their home life, tobacco status, and drug and alcohol status.   Vaccines: uptodate Prostate Cancer Screen: no early fam history Colon Cancer Screen: no early fam history      Smoking Status: none ETOH/ drug use: none HIV screen:  refused.

## 2018-04-08 NOTE — Patient Instructions (Signed)
Call to schedule second opinion  at [facility].

## 2018-04-25 ENCOUNTER — Other Ambulatory Visit: Payer: Self-pay | Admitting: Family Medicine

## 2018-05-03 ENCOUNTER — Encounter: Payer: Self-pay | Admitting: Internal Medicine

## 2018-05-03 ENCOUNTER — Ambulatory Visit: Payer: BC Managed Care – PPO | Admitting: Internal Medicine

## 2018-05-03 VITALS — BP 130/80 | HR 97 | Temp 99.5°F | Wt 180.0 lb

## 2018-05-03 DIAGNOSIS — J069 Acute upper respiratory infection, unspecified: Secondary | ICD-10-CM

## 2018-05-03 DIAGNOSIS — R05 Cough: Secondary | ICD-10-CM

## 2018-05-03 DIAGNOSIS — B9789 Other viral agents as the cause of diseases classified elsewhere: Secondary | ICD-10-CM | POA: Diagnosis not present

## 2018-05-03 MED ORDER — ALBUTEROL SULFATE HFA 108 (90 BASE) MCG/ACT IN AERS: 2.0000 | INHALATION_SPRAY | Freq: Four times a day (QID) | RESPIRATORY_TRACT | 0 refills | Status: DC | PRN

## 2018-05-03 MED ORDER — METHYLPREDNISOLONE ACETATE 80 MG/ML IJ SUSP
80.0000 mg | Freq: Once | INTRAMUSCULAR | Status: AC
  Administered 2018-05-03: 80 mg via INTRAMUSCULAR

## 2018-05-03 MED ORDER — HYDROCOD POLST-CPM POLST ER 10-8 MG/5ML PO SUER: 5.0000 mL | Freq: Every evening | ORAL | 0 refills | Status: DC | PRN

## 2018-05-03 NOTE — Progress Notes (Signed)
HPI  Pt presents to the clinic today with c/o runny nose, nasal congestion, cough, wheezing and body aches. He reports this started 2 days ago. The cough is productive of yellow mucous. He reports a burning sensation in his chest, mostly when coughing. He denies chest pain or shortness of breath. He denies ear pain or sore throat. He is running low grade fevers but denies chills. He has tried allergy nasal spray and Robitussin OTC with minimal relief. He has no history of allergies. He has not had sick contacts. He has had pneumonia in the past.  Review of Systems      Past Medical History:  Diagnosis Date  . GERD (gastroesophageal reflux disease)   . Heart murmur   . Hypertension     Family History  Problem Relation Age of Onset  . Hypertension Mother   . Stroke Mother   . Heart disease Maternal Grandfather   . Dementia Maternal Grandfather     Social History   Socioeconomic History  . Marital status: Married    Spouse name: Not on file  . Number of children: Not on file  . Years of education: Not on file  . Highest education level: Not on file  Occupational History  . Not on file  Social Needs  . Financial resource strain: Not on file  . Food insecurity:    Worry: Not on file    Inability: Not on file  . Transportation needs:    Medical: Not on file    Non-medical: Not on file  Tobacco Use  . Smoking status: Never Smoker  . Smokeless tobacco: Never Used  Substance and Sexual Activity  . Alcohol use: No  . Drug use: No  . Sexual activity: Not on file  Lifestyle  . Physical activity:    Days per week: Not on file    Minutes per session: Not on file  . Stress: Not on file  Relationships  . Social connections:    Talks on phone: Not on file    Gets together: Not on file    Attends religious service: Not on file    Active member of club or organization: Not on file    Attends meetings of clubs or organizations: Not on file    Relationship status: Not on file   . Intimate partner violence:    Fear of current or ex partner: Not on file    Emotionally abused: Not on file    Physically abused: Not on file    Forced sexual activity: Not on file  Other Topics Concern  . Not on file  Social History Narrative  . Not on file    Allergies  Allergen Reactions  . Aspirin     REACTION: Nausea and vomiting  . Milk-Related Compounds     REACTION: GI  . Penicillins     REACTION: Nausea and vomiting  . Propoxyphene Hcl     REACTION: Rash  . Tetanus Toxoids      Constitutional: Positive fever, body aches. Denies headache, fatigue, or abrupt weight changes.  HEENT:  Positive runny nose, nasal congestion. Denies eye redness, eye pain, pressure behind the eyes, facial pain, ear pain, ringing in the ears, wax buildup, or bloody nose. Respiratory: Positive cough. Denies difficulty breathing or shortness of breath.  Cardiovascular: Denies chest pain, chest tightness, palpitations or swelling in the hands or feet.   No other specific complaints in a complete review of systems (except as listed in HPI above).  Objective:   BP 130/80   Pulse 97   Temp 99.5 F (37.5 C) (Oral)   Wt 180 lb (81.6 kg)   SpO2 98%   BMI 26.97 kg/m  Wt Readings from Last 3 Encounters:  05/03/18 180 lb (81.6 kg)  04/08/18 179 lb 12 oz (81.5 kg)  11/24/17 176 lb (79.8 kg)     General: Appears his stated age, well developed, well nourished in NAD. HEENT: Head: normal shape and size;  ose: mucosa pink and moist, septum midline; Throat/Mouth: + PND. Teeth present, mucosa erythematous and moist, no exudate noted, no lesions or ulcerations noted.  Neck: No cervical lymphadenopathy.  Pulmonary/Chest: Normal effort and positive vesicular breath sounds. No respiratory distress. No wheezes, rales or ronchi noted.       Assessment & Plan:   Viral Upper Respiratory Infection with Cough:  Get some rest and drink plenty of water 80 mg Depo IM today Start Allegra and Flonase  OTC eRx for Albuterol inhaler eRx for Tussionex cough syrup Work note provided  RTC as needed or if symptoms persist.   Webb Silversmith, NP

## 2018-05-03 NOTE — Patient Instructions (Signed)
Upper Respiratory Infection, Adult Most upper respiratory infections (URIs) are caused by a virus. A URI affects the nose, throat, and upper air passages. The most common type of URI is often called "the common cold." Follow these instructions at home:  Take medicines only as told by your doctor.  Gargle warm saltwater or take cough drops to comfort your throat as told by your doctor.  Use a warm mist humidifier or inhale steam from a shower to increase air moisture. This may make it easier to breathe.  Drink enough fluid to keep your pee (urine) clear or pale yellow.  Eat soups and other clear broths.  Have a healthy diet.  Rest as needed.  Go back to work when your fever is gone or your doctor says it is okay. ? You may need to stay home longer to avoid giving your URI to others. ? You can also wear a face mask and wash your hands often to prevent spread of the virus.  Use your inhaler more if you have asthma.  Do not use any tobacco products, including cigarettes, chewing tobacco, or electronic cigarettes. If you need help quitting, ask your doctor. Contact a doctor if:  You are getting worse, not better.  Your symptoms are not helped by medicine.  You have chills.  You are getting more short of breath.  You have brown or red mucus.  You have yellow or brown discharge from your nose.  You have pain in your face, especially when you bend forward.  You have a fever.  You have puffy (swollen) neck glands.  You have pain while swallowing.  You have white areas in the back of your throat. Get help right away if:  You have very bad or constant: ? Headache. ? Ear pain. ? Pain in your forehead, behind your eyes, and over your cheekbones (sinus pain). ? Chest pain.  You have long-lasting (chronic) lung disease and any of the following: ? Wheezing. ? Long-lasting cough. ? Coughing up blood. ? A change in your usual mucus.  You have a stiff neck.  You have  changes in your: ? Vision. ? Hearing. ? Thinking. ? Mood. This information is not intended to replace advice given to you by your health care provider. Make sure you discuss any questions you have with your health care provider. Document Released: 01/20/2008 Document Revised: 04/05/2016 Document Reviewed: 11/08/2013 Elsevier Interactive Patient Education  2018 Elsevier Inc.  

## 2018-05-09 ENCOUNTER — Other Ambulatory Visit: Payer: Self-pay | Admitting: Gastroenterology

## 2018-05-09 DIAGNOSIS — K5281 Eosinophilic gastritis or gastroenteritis: Secondary | ICD-10-CM

## 2018-05-12 ENCOUNTER — Ambulatory Visit (INDEPENDENT_AMBULATORY_CARE_PROVIDER_SITE_OTHER): Payer: BC Managed Care – PPO | Admitting: Gastroenterology

## 2018-05-12 ENCOUNTER — Encounter: Payer: Self-pay | Admitting: Gastroenterology

## 2018-05-12 VITALS — BP 131/76 | HR 63 | Ht 68.5 in | Wt 186.2 lb

## 2018-05-12 DIAGNOSIS — K5282 Eosinophilic colitis: Secondary | ICD-10-CM

## 2018-05-12 MED ORDER — BUDESONIDE 3 MG PO CPEP: ORAL_CAPSULE | ORAL | 0 refills | Status: DC

## 2018-05-12 NOTE — Progress Notes (Signed)
[name], MD [address]  [place]  [place],  [address]  Main: [phone]  Fax: [phone]   Primary Care Physician: Jinny Sanders, MD  Primary Gastroenterologist:  Dr. [name]  Chief Complaint  Patient presents with  . Follow-up    eosinophilic colitis    HPI: Kyle Johnson is a 47 y.o. male here for follow-up of eosinophilic enterocolitis.  Patient has been on budesonide 9 mg daily.  Patient canceled his last appointment with [place] and was not seen like he was recommended to.  He has been doing well with no further nausea vomiting or diarrhea since starting the medication.  Has gained weight.  No signs of infection.  No abdominal pain.  No weight loss.  No altered bowel habits, melena or hematochezia.  His potassium is also normal on recent blood work.  Previous history: Patient initially seen in clinic on December 14, 2018due to episodic nausea, vomiting, diarrhea, and 20 pound weight loss. Since then patient has undergone workup, including EGD and colonoscopy. See procedure report and pathology report below for findings.  See colonoscopy and EGD report from August 20, 2017. Colonic and terminal ileum mucosa was normal and random biopsies were taken. See pathology report that showed mucosal eosinophilia interminal ileum and colonic biopsies. Mild subepithelial collagen deposition was also seen on the colon biopsies. Sessile serrated adenoma and hyperplastic polyps wereremoved.esophageal biopsies did not show any eosinophils."  Pathology slides were sent to, Dr. Lura Em, who is a specific GI pathologist. See the scanned report under media for his findings.As reported on my telephone encounter:  "He states that his biopsies are consistent with eosinophilic enteritis and colitis. He did not see any eosinophils in his esophagus or gastric biopsies. He states the eosinophils are enough in number to be consistent with  eosinophilic enteritis or colitis. He states that there might be soft or subtle findings of chronicity, but is not convincing for chronicity. Differential diagnosis would include IBD, drug side effect, parasitic infection.  His [facility] labs, were negative for all the parasitic antibodies that we ordered. He did not have any chronic symptoms to point to IBD."  He has [facility], and he did not test positive for any food allergens. He has seen hematology/oncology due to mildly low IgG, and slightly elevated IgE,and they have completed their workup, and did not find any significant abnormalities.  6 food elimination diet was attempted but patient did not do well on it and thus budesonide was started on last visit  Current Outpatient Medications  Medication Sig Dispense Refill  . albuterol (PROVENTIL HFA;VENTOLIN HFA) 108 (90 Base) MCG/ACT inhaler Inhale 2 puffs into the lungs every 6 (six) hours as needed for wheezing or shortness of breath. 1 Inhaler 0  . amLODipine (NORVASC) 5 MG tablet TAKE 1 TABLET (5 MG TOTAL) BY MOUTH DAILY. 90 tablet 1  . budesonide (ENTOCORT EC) 3 MG 24 hr capsule Take 2 capsules (6 mg total) by mouth daily for 30 days, THEN 1 capsule (3 mg total) daily. 90 capsule 0  . ferrous sulfate 325 (65 FE) MG EC tablet Take 325 mg by mouth daily with breakfast.    . vitamin B-12 (CYANOCOBALAMIN) 1000 MCG tablet Take 1 tablet (1,000 mcg total) by mouth daily. 30 tablet 2   No current facility-administered medications for this visit.     Allergies as of 05/12/2018 - Review Complete 05/03/2018  Allergen Reaction Noted  . Aspirin    .  Milk-related compounds    . Penicillins    . Propoxyphene hcl    . Tetanus toxoids  11/11/2016    ROS:  General: Negative for anorexia, weight loss, fever, chills, fatigue, weakness. ENT: Negative for hoarseness, difficulty swallowing , nasal congestion. CV: Negative for chest pain,  angina, palpitations, dyspnea on exertion, peripheral edema.  Respiratory: Negative for dyspnea at rest, dyspnea on exertion, cough, sputum, wheezing.  GI: See history of present illness. GU:  Negative for dysuria, hematuria, urinary incontinence, urinary frequency, nocturnal urination.  Endo: Negative for unusual weight change.    Physical Examination:   BP 131/76   Pulse 63   Ht 5' 8.5" (1.74 m)   Wt 186 lb 3.2 oz (84.5 kg)   BMI 27.90 kg/m   General: Well-nourished, well-developed in no acute distress.  Eyes: No icterus. Conjunctivae pink. Mouth: Oropharyngeal mucosa moist and pink , no lesions erythema or exudate. Neck: Supple, Trachea midline Abdomen: Bowel sounds are normal, nontender, nondistended, no hepatosplenomegaly or masses, no abdominal bruits or hernia , no rebound or guarding.   Extremities: No lower extremity edema. No clubbing or deformities. Neuro: Alert and oriented x 3.  Grossly intact. Skin: Warm and dry, no jaundice.   Psych: Alert and cooperative, normal mood and affect.   Labs: CMP     Component Value Date/Time   NA 141 04/01/2018 0836   NA 138 07/13/2013 0041   K 3.9 04/01/2018 0836   K 3.9 07/13/2013 0041   CL 104 04/01/2018 0836   CL 103 07/13/2013 0041   CO2 32 04/01/2018 0836   CO2 27 07/13/2013 0041   GLUCOSE 88 04/01/2018 0836   GLUCOSE 142 (H) 07/13/2013 0041   BUN 20 04/01/2018 0836   BUN 15 07/13/2013 0041   CREATININE 1.10 04/01/2018 0836   CREATININE 0.98 07/13/2013 0041   CALCIUM 9.5 04/01/2018 0836   CALCIUM 9.4 07/13/2013 0041   PROT 6.2 04/01/2018 0836   PROT 7.9 07/13/2013 0041   ALBUMIN 4.5 04/01/2018 0836   ALBUMIN 4.4 07/13/2013 0041   AST 11 04/01/2018 0836   AST 27 07/13/2013 0041   ALT 14 04/01/2018 0836   ALT 48 07/13/2013 0041   ALKPHOS 59 04/01/2018 0836   ALKPHOS 95 07/13/2013 0041   BILITOT 1.1 04/01/2018 0836   BILITOT 0.6 07/13/2013 0041   GFRNONAA >60 11/24/2017 1501   GFRNONAA >60 07/13/2013 0041    GFRAA >60 11/24/2017 1501   GFRAA >60 07/13/2013 0041   Lab Results  Component Value Date   WBC 7.2 11/24/2017   HGB 14.4 11/24/2017   HCT 42.4 11/24/2017   MCV 85.2 11/24/2017   PLT 180 11/24/2017    Imaging Studies: No results found.  Assessment and Plan:   BREYLIN DOM is a 47 y.o. y/o male with eosinophilic enterocolitis here for follow-up, doing well on budesonide  Patient canceled his last appointment with [place] even though the importance of regular follow-up was discussed in detail on previous visits He is doing well since start of budesonide We will now taper his medication, and he was asked to start taking 6 mg a day which would be 2 pills a day instead of the 3 that he has been taking.  He will continue this for 1 month and then taper it further to 1 pill a day and then stop.  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  If symptoms recur on tapering the medication, patient was asked to call us and he verbalized understanding.  Dr [name]

## 2018-05-12 NOTE — Patient Instructions (Addendum)
F/U 1 month See PCP for [place] medication Taper budesonide-see print out Make appt at [facility] for referral.

## 2018-05-16 ENCOUNTER — Telehealth: Payer: Self-pay

## 2018-05-16 NOTE — Telephone Encounter (Signed)
Sharyn Lull (pts wife) called for phone number and name of doctor for the referral made in April at [place]. The previous appt they could not keep.  Referred to Dr. Lisbeth Renshaw, phone # [phone]. Fax: [phone].

## 2018-06-07 ENCOUNTER — Other Ambulatory Visit: Payer: Self-pay | Admitting: Gastroenterology

## 2018-06-07 DIAGNOSIS — K5281 Eosinophilic gastritis or gastroenteritis: Secondary | ICD-10-CM

## 2018-06-23 ENCOUNTER — Encounter: Payer: Self-pay | Admitting: Gastroenterology

## 2018-06-23 ENCOUNTER — Ambulatory Visit (INDEPENDENT_AMBULATORY_CARE_PROVIDER_SITE_OTHER): Payer: BC Managed Care – PPO | Admitting: Gastroenterology

## 2018-06-23 VITALS — BP 134/70 | HR 60 | Ht 68.5 in | Wt 183.4 lb

## 2018-06-23 DIAGNOSIS — K5282 Eosinophilic colitis: Secondary | ICD-10-CM | POA: Diagnosis not present

## 2018-06-24 NOTE — Progress Notes (Signed)
[name], MD [address]  [place]  [place], [place] [address]  Main: [phone]  Fax: [phone]   Primary Care Physician: Jinny Sanders, MD  Primary Gastroenterologist:  Dr. [name]  Chief Complaint  Patient presents with  . Follow-up    Eosinophilic colitis    HPI: Kyle Johnson is a 47 y.o. 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  Currently on 3 mg Entocort daily and doing well.  Has not noticed any changes in symptoms after decreasing Uceris 9 mg to 6 mg to now 3 mg.  He will be finishing the 3 mg dose at the end of this month.  No nausea or vomiting or diarrhea.  Has a handout of food elimination diet and tries to avoid those foods for the most part.  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  Patient states he has been eating better, feeling better, with improved energy level and appetite and food intake since being on the Entocort overall.  Previous history: Patient initially seen in clinic on December 14, 2018due to episodic nausea, vomiting, diarrhea, and 20 pound weight loss. Since then patient has undergone workup, including EGD and colonoscopy. See procedure report and pathology report below for findings.  See colonoscopy and EGD report from August 20, 2017. Colonic and terminal ileum mucosa was normal and random biopsies were taken. See pathology report that showed mucosal eosinophilia interminal ileum and colonic biopsies. Mild subepithelial collagen deposition was also seen on the colon biopsies. Sessile serrated adenoma and hyperplastic polyps wereremoved.esophageal biopsies did not show any eosinophils."  Pathology slides were sent to, Dr. Lura Em, who is a specific GI pathologist. See the scanned report under  media for his findings.As reported on my telephone encounter:  "He states that his biopsies are consistent with eosinophilic enteritis and colitis. He did not see any eosinophils in his esophagus or gastric biopsies. He states the eosinophils are enough in number to be consistent with eosinophilic enteritis or colitis. He states that there might be soft or subtle findings of chronicity, but is not convincing for chronicity. Differential diagnosis would include IBD, drug side effect, parasitic infection.  His [facility] labs, were negative for all the parasitic antibodies that we ordered. He did not have any chronic symptoms to point to IBD."  He [facility], and he did not test positive for any food allergens. He has seen hematology/oncology due to mildly low IgG, and slightly elevated IgE,and they have completed their workup, and did not find any significant abnormalities.  6 food elimination diet was attempted but patient did not do well on it and thus budesonide was started on last visit  Current Outpatient Medications  Medication Sig Dispense Refill  . albuterol (PROVENTIL HFA;VENTOLIN HFA) 108 (90 Base) MCG/ACT inhaler Inhale 2 puffs into the lungs every 6 (six) hours as needed for wheezing or shortness of breath. 1 Inhaler 0  . amLODipine (NORVASC) 5 MG tablet TAKE 1 TABLET (5 MG TOTAL) BY MOUTH DAILY. 90 tablet 1  . vitamin B-12 (CYANOCOBALAMIN) 1000 MCG tablet Take 1 tablet (1,000 mcg total) by mouth daily. 30 tablet 2  . budesonide (ENTOCORT EC) 3 MG 24 hr capsule Take 1 capsule (3 mg total) by mouth daily. (Patient not taking: Reported on  06/23/2018) 30 capsule 0  . ferrous sulfate 325 (65 FE) MG EC tablet Take 325 mg by mouth daily with breakfast.     No current facility-administered medications for this visit.     Allergies as of 06/23/2018 - Review Complete 06/23/2018  Allergen Reaction Noted  . Aspirin    .  Milk-related compounds    . Penicillins    . Propoxyphene hcl    . Tetanus toxoids  11/11/2016    ROS:  General: Negative for anorexia, weight loss, fever, chills, fatigue, weakness. ENT: Negative for hoarseness, difficulty swallowing , nasal congestion. CV: Negative for chest pain, angina, palpitations, dyspnea on exertion, peripheral edema.  Respiratory: Negative for dyspnea at rest, dyspnea on exertion, cough, sputum, wheezing.  GI: See history of present illness. GU:  Negative for dysuria, hematuria, urinary incontinence, urinary frequency, nocturnal urination.  Endo: Negative for unusual weight change.    Physical Examination:   BP 134/70   Pulse 60   Ht 5' 8.5" (1.74 m)   Wt 183 lb 6.4 oz (83.2 kg)   BMI 27.48 kg/m   General: Well-nourished, well-developed in no acute distress.  Eyes: No icterus. Conjunctivae pink. Mouth: Oropharyngeal mucosa moist and pink , no lesions erythema or exudate. Neck: Supple, Trachea midline Abdomen: Bowel sounds are normal, nontender, nondistended, no hepatosplenomegaly or masses, no abdominal bruits or hernia , no rebound or guarding.   Extremities: No lower extremity edema. No clubbing or deformities. Neuro: Alert and oriented x 3.  Grossly intact. Skin: Warm and dry, no jaundice.   Psych: Alert and cooperative, normal mood and affect.   Labs: CMP     Component Value Date/Time   NA 141 04/01/2018 0836   NA 138 07/13/2013 0041   K 3.9 04/01/2018 0836   K 3.9 07/13/2013 0041   CL 104 04/01/2018 0836   CL 103 07/13/2013 0041   CO2 32 04/01/2018 0836   CO2 27 07/13/2013 0041   GLUCOSE 88 04/01/2018 0836   GLUCOSE 142 (H) 07/13/2013 0041   BUN 20 04/01/2018 0836   BUN 15 07/13/2013 0041   CREATININE 1.10 04/01/2018 0836   CREATININE 0.98 07/13/2013 0041   CALCIUM 9.5 04/01/2018 0836   CALCIUM 9.4 07/13/2013 0041   PROT 6.2 04/01/2018 0836   PROT 7.9 07/13/2013 0041   ALBUMIN 4.5 04/01/2018 0836   ALBUMIN 4.4 07/13/2013 0041     AST 11 04/01/2018 0836   AST 27 07/13/2013 0041   ALT 14 04/01/2018 0836   ALT 48 07/13/2013 0041   ALKPHOS 59 04/01/2018 0836   ALKPHOS 95 07/13/2013 0041   BILITOT 1.1 04/01/2018 0836   BILITOT 0.6 07/13/2013 0041   GFRNONAA >60 11/24/2017 1501   GFRNONAA >60 07/13/2013 0041   GFRAA >60 11/24/2017 1501   GFRAA >60 07/13/2013 0041   Lab Results  Component Value Date   WBC 7.2 11/24/2017   HGB 14.4 11/24/2017   HCT 42.4 11/24/2017   MCV 85.2 11/24/2017   PLT 180 11/24/2017    Imaging Studies: No results found.  Assessment and Plan:   Kyle Johnson is a 47 y.o. y/o male with history of eosinophilic enterocolitis currently on Entocort and doing well here for follow-up, and also was seen at [facility] for second opinion  Continue Entocort taper, currently on 3 mg daily which she will finish by the end of November with no further refills Patient educated to continue on food elimination diet like he has been and has been doing well with  it No recurrent symptoms at this time If symptoms recur or if he has any questions or concern he was asked to contact us and he verbalized understanding  If symptoms recur, can consider the newly available blood testing for food allergens Colonoscopy up-to-date, surveillance due 5 years from January 2019   Dr [name]

## 2018-07-04 ENCOUNTER — Other Ambulatory Visit: Payer: Self-pay | Admitting: Gastroenterology

## 2018-07-04 DIAGNOSIS — K5281 Eosinophilic gastritis or gastroenteritis: Secondary | ICD-10-CM

## 2018-08-23 ENCOUNTER — Other Ambulatory Visit: Payer: Self-pay | Admitting: Gastroenterology

## 2018-09-01 ENCOUNTER — Encounter: Payer: Self-pay | Admitting: Gastroenterology

## 2018-09-01 ENCOUNTER — Ambulatory Visit: Payer: BC Managed Care – PPO | Admitting: Gastroenterology

## 2018-09-01 DIAGNOSIS — K5282 Eosinophilic colitis: Secondary | ICD-10-CM

## 2018-10-20 ENCOUNTER — Other Ambulatory Visit: Payer: Self-pay | Admitting: Family Medicine

## 2019-04-15 ENCOUNTER — Telehealth: Payer: Self-pay | Admitting: Family Medicine

## 2019-04-17 NOTE — Telephone Encounter (Signed)
Please schedule CPE with fasting labs prior for Dr. Bedsole. 

## 2019-04-21 NOTE — Telephone Encounter (Signed)
Labs 10/9 cpx 10/16 Pt aware

## 2019-05-19 ENCOUNTER — Telehealth: Payer: Self-pay | Admitting: Family Medicine

## 2019-05-19 DIAGNOSIS — I10 Essential (primary) hypertension: Secondary | ICD-10-CM

## 2019-05-19 DIAGNOSIS — E7800 Pure hypercholesterolemia, unspecified: Secondary | ICD-10-CM

## 2019-05-19 NOTE — Telephone Encounter (Signed)
-----   Message from Ellamae Sia sent at 05/19/2019 10:03 AM EDT ----- Regarding: Lab orders for Friday, 10.9.20 Patient is scheduled for CPX labs, please order future labs, Thanks , Karna Christmas

## 2019-05-26 ENCOUNTER — Other Ambulatory Visit (INDEPENDENT_AMBULATORY_CARE_PROVIDER_SITE_OTHER): Payer: BC Managed Care – PPO

## 2019-05-26 ENCOUNTER — Other Ambulatory Visit: Payer: Self-pay

## 2019-05-26 DIAGNOSIS — E7800 Pure hypercholesterolemia, unspecified: Secondary | ICD-10-CM

## 2019-05-26 LAB — LIPID PANEL
Cholesterol: 159 mg/dL (ref 0–200)
HDL: 34.3 mg/dL — ABNORMAL LOW (ref >39.00)
LDL Cholesterol: 104 mg/dL — ABNORMAL HIGH (ref 0–99)
NonHDL: 124.4
Total CHOL/HDL Ratio: 5
Triglycerides: 102 mg/dL (ref 0.0–149.0)
VLDL: 20.4 mg/dL (ref 0.0–40.0)

## 2019-05-26 LAB — COMPREHENSIVE METABOLIC PANEL
ALT: 27 U/L (ref 0–53)
AST: 16 U/L (ref 0–37)
Albumin: 4.2 g/dL (ref 3.5–5.2)
Alkaline Phosphatase: 75 U/L (ref 39–117)
BUN: 17 mg/dL (ref 6–23)
CO2: 30 mEq/L (ref 19–32)
Calcium: 9.1 mg/dL (ref 8.4–10.5)
Chloride: 104 mEq/L (ref 96–112)
Creatinine, Ser: 0.97 mg/dL (ref 0.40–1.50)
GFR: 82.08 mL/min (ref >60.00)
Glucose, Bld: 101 mg/dL — ABNORMAL HIGH (ref 70–99)
Potassium: 3.8 mEq/L (ref 3.5–5.1)
Sodium: 140 mEq/L (ref 135–145)
Total Bilirubin: 0.6 mg/dL (ref 0.2–1.2)
Total Protein: 6.4 g/dL (ref 6.0–8.3)

## 2019-05-26 NOTE — Progress Notes (Signed)
No critical labs need to be addressed urgently. We will discuss labs in detail at upcoming office visit.   

## 2019-06-02 ENCOUNTER — Ambulatory Visit (INDEPENDENT_AMBULATORY_CARE_PROVIDER_SITE_OTHER): Payer: BC Managed Care – PPO | Admitting: Family Medicine

## 2019-06-02 ENCOUNTER — Other Ambulatory Visit: Payer: Self-pay

## 2019-06-02 ENCOUNTER — Encounter: Payer: Self-pay | Admitting: Family Medicine

## 2019-06-02 VITALS — BP 140/80 | HR 92 | Temp 98.7°F | Ht 69.5 in | Wt 200.5 lb

## 2019-06-02 DIAGNOSIS — E7800 Pure hypercholesterolemia, unspecified: Secondary | ICD-10-CM | POA: Diagnosis not present

## 2019-06-02 DIAGNOSIS — Z23 Encounter for immunization: Secondary | ICD-10-CM

## 2019-06-02 DIAGNOSIS — I10 Essential (primary) hypertension: Secondary | ICD-10-CM

## 2019-06-02 DIAGNOSIS — Z0000 Encounter for general adult medical examination without abnormal findings: Secondary | ICD-10-CM | POA: Diagnosis not present

## 2019-06-02 NOTE — Progress Notes (Signed)
Chief Complaint  Patient presents with  . Annual Exam    History of Present Illness: HPI  The patient is here for annual wellness exam and preventative care.    Hypertension:   Borderline control in office today on amlodipine... did not take today BP Readings from Last 3 Encounters:  06/02/19 140/80  06/23/18 134/70  05/12/18 131/76  Using medication without problems or lightheadedness:  none Chest pain with exertion: none Edema:none Short of breath:none Average home BPs: 128/78 Other issues:  Elevated Cholesterol:  Good/tolerable control with diet Lab Results  Component Value Date   CHOL 159 05/26/2019   HDL 34.30 (L) 05/26/2019   LDLCALC 104 (H) 05/26/2019   TRIG 102.0 05/26/2019   CHOLHDL 5 05/26/2019  Using medications without problems: Muscle aches:  Diet compliance: moderate, low fat Exercise: staying active Other complaints:   eosinophilic colitis: Followed by GI Dr. Chuck Hint   No longer on steroid but it caused him to gain weight. Wt Readings from Last 3 Encounters:  06/02/19 200 lb 8 oz (90.9 kg)  06/23/18 183 lb 6.4 oz (83.2 kg)  05/12/18 186 lb 3.2 oz (84.5 kg)     COVID 19 screen No recent travel or known exposure to [place] The patient denies respiratory symptoms of COVID 19 at this time.  The importance of social distancing was discussed today.   Review of Systems  Constitutional: Negative for chills and fever.  HENT: Negative for congestion and ear pain.   Eyes: Negative for pain and redness.  Respiratory: Negative for cough and shortness of breath.   Cardiovascular: Negative for chest pain, palpitations and leg swelling.  Gastrointestinal: Negative for abdominal pain, blood in stool, constipation, diarrhea, nausea and vomiting.  Genitourinary: Negative for dysuria.  Musculoskeletal: Negative for falls and myalgias.  Skin: Negative for rash.  Neurological: Negative for dizziness.  Psychiatric/Behavioral: Negative for depression. The patient  is not nervous/anxious.       Past Medical History:  Diagnosis Date  . GERD (gastroesophageal reflux disease)   . Heart murmur   . Hypertension     reports that he has never smoked. He has never used smokeless tobacco. He reports that he does not drink alcohol or use drugs.   Current Outpatient Medications:  .  albuterol (PROVENTIL HFA;VENTOLIN HFA) 108 (90 Base) MCG/ACT inhaler, Inhale 2 puffs into the lungs every 6 (six) hours as needed for wheezing or shortness of breath., Disp: 1 Inhaler, Rfl: 0 .  amLODipine (NORVASC) 5 MG tablet, TAKE 1 TABLET (5 MG TOTAL) BY MOUTH DAILY., Disp: 90 tablet, Rfl: 0   Observations/Objective: Blood pressure 140/80, pulse 92, temperature 98.7 F (37.1 C), temperature source Temporal, height 5' 9.5" (1.765 m), weight 200 lb 8 oz (90.9 kg), SpO2 97 %.  Physical Exam Constitutional:      General: He is not in acute distress.    Appearance: Normal appearance. He is well-developed. He is not ill-appearing or toxic-appearing.  HENT:     Head: Normocephalic and atraumatic.     Right Ear: Hearing, tympanic membrane, ear canal and external ear normal.     Left Ear: Hearing, tympanic membrane, ear canal and external ear normal.     Nose: Nose normal.     Mouth/Throat:     Pharynx: Uvula midline.  Eyes:     General: Lids are normal. Lids are everted, no foreign bodies appreciated.     Conjunctiva/sclera: Conjunctivae normal.     Pupils: Pupils are equal, round, and reactive to light.  Neck:     Musculoskeletal: Normal range of motion and neck supple.     Thyroid: No thyroid mass or thyromegaly.     Vascular: No carotid bruit.     Trachea: Trachea and phonation normal.  Cardiovascular:     Rate and Rhythm: Normal rate and regular rhythm.     Pulses: Normal pulses.     Heart sounds: S1 normal and S2 normal. No murmur. No gallop.   Pulmonary:     Breath sounds: Normal breath sounds. No wheezing, rhonchi or rales.  Abdominal:     General: Bowel sounds  are normal.     Palpations: Abdomen is soft.     Tenderness: There is no abdominal tenderness. There is no guarding or rebound.     Hernia: No hernia is present.  Lymphadenopathy:     Cervical: No cervical adenopathy.  Skin:    General: Skin is warm and dry.     Findings: No rash.  Neurological:     Mental Status: He is alert.     Cranial Nerves: No cranial nerve deficit.     Sensory: No sensory deficit.     Gait: Gait normal.     Deep Tendon Reflexes: Reflexes are normal and symmetric.  Psychiatric:        Speech: Speech normal.        Behavior: Behavior normal.        Judgment: Judgment normal.      Assessment and Plan The patient's preventative maintenance and recommended screening tests for an annual wellness exam were reviewed in full today. Brought up to date unless services declined.  Counselled on the importance of diet, exercise, and its role in overall health and mortality. The patient's FH and SH was reviewed, including their home life, tobacco status, and drug and alcohol status.   Vaccines: uptodate otherwise but due for flu.Marland Kitchen given today vaccine Prostate Cancer Screen:no early fam history Colon Cancer Screen:no early fam history      Smoking Status:none ETOH/ drug [id number] HIV screen:refused.   Essential hypertension Well controlled. Continue current medication.   PURE HYPERCHOLESTEROLEMIA  Work on low cholesterol diet.         Eliezer Lofts, MD

## 2019-06-02 NOTE — Assessment & Plan Note (Signed)
Work on low-cholesterol diet.

## 2019-06-02 NOTE — Assessment & Plan Note (Signed)
Well controlled. Continue current medication.  

## 2019-06-02 NOTE — Patient Instructions (Signed)
Try to decrease cholesterol and carb in diet as able.  Increase exercise as able.

## 2019-06-02 NOTE — Addendum Note (Signed)
Addended by: Carter Kitten on: 06/02/2019 09:38 AM   Modules accepted: Orders

## 2019-07-21 ENCOUNTER — Other Ambulatory Visit: Payer: Self-pay | Admitting: Family Medicine

## 2020-01-16 ENCOUNTER — Other Ambulatory Visit: Payer: Self-pay | Admitting: Family Medicine

## 2020-04-17 ENCOUNTER — Other Ambulatory Visit: Payer: Self-pay | Admitting: Family Medicine

## 2020-04-20 ENCOUNTER — Other Ambulatory Visit: Payer: Self-pay

## 2020-04-20 ENCOUNTER — Encounter: Payer: Self-pay | Admitting: Emergency Medicine

## 2020-04-20 ENCOUNTER — Emergency Department: Payer: BC Managed Care – PPO

## 2020-04-20 DIAGNOSIS — I10 Essential (primary) hypertension: Secondary | ICD-10-CM | POA: Diagnosis not present

## 2020-04-20 DIAGNOSIS — N201 Calculus of ureter: Secondary | ICD-10-CM | POA: Diagnosis not present

## 2020-04-20 DIAGNOSIS — D122 Benign neoplasm of ascending colon: Secondary | ICD-10-CM | POA: Insufficient documentation

## 2020-04-20 DIAGNOSIS — R109 Unspecified abdominal pain: Secondary | ICD-10-CM | POA: Insufficient documentation

## 2020-04-20 DIAGNOSIS — K7689 Other specified diseases of liver: Secondary | ICD-10-CM | POA: Diagnosis not present

## 2020-04-20 DIAGNOSIS — Z79899 Other long term (current) drug therapy: Secondary | ICD-10-CM | POA: Insufficient documentation

## 2020-04-20 LAB — BASIC METABOLIC PANEL
Anion gap: 7 (ref 5–15)
BUN: 15 mg/dL (ref 6–20)
CO2: 27 mmol/L (ref 22–32)
Calcium: 9.2 mg/dL (ref 8.9–10.3)
Chloride: 105 mmol/L (ref 98–111)
Creatinine, Ser: 1.3 mg/dL — ABNORMAL HIGH (ref 0.61–1.24)
GFR calc Af Amer: >60 mL/min (ref >60)
GFR calc non Af Amer: >60 mL/min (ref >60)
Glucose, Bld: 122 mg/dL — ABNORMAL HIGH (ref 70–99)
Potassium: 3.4 mmol/L — ABNORMAL LOW (ref 3.5–5.1)
Sodium: 139 mmol/L (ref 135–145)

## 2020-04-20 LAB — URINALYSIS, COMPLETE (UACMP) WITH MICROSCOPIC
Bacteria, UA: NONE SEEN
Bilirubin Urine: NEGATIVE
Glucose, UA: NEGATIVE mg/dL
Hgb urine dipstick: NEGATIVE
Ketones, ur: NEGATIVE mg/dL
Leukocytes,Ua: NEGATIVE
Nitrite: NEGATIVE
Protein, ur: NEGATIVE mg/dL
Specific Gravity, Urine: 1.02 (ref 1.005–1.030)
Squamous Epithelial / HPF: NONE SEEN (ref 0–5)
pH: 6 (ref 5.0–8.0)

## 2020-04-20 LAB — CBC
HCT: 44.3 % (ref 39.0–52.0)
Hemoglobin: 15.2 g/dL (ref 13.0–17.0)
MCH: 28.7 pg (ref 26.0–34.0)
MCHC: 34.3 g/dL (ref 30.0–36.0)
MCV: 83.6 fL (ref 80.0–100.0)
Platelets: 218 10*3/uL (ref 150–400)
RBC: 5.3 MIL/uL (ref 4.22–5.81)
RDW: 12.4 % (ref 11.5–15.5)
WBC: 10 10*3/uL (ref 4.0–10.5)
nRBC: 0 % (ref 0.0–0.2)

## 2020-04-20 MED ORDER — ONDANSETRON 4 MG PO TBDP
4.0000 mg | ORAL_TABLET | Freq: Once | ORAL | Status: AC | PRN
  Administered 2020-04-20: 4 mg via ORAL
  Filled 2020-04-20: qty 1

## 2020-04-20 MED ORDER — OXYCODONE-ACETAMINOPHEN 5-325 MG PO TABS
1.0000 | ORAL_TABLET | ORAL | Status: AC | PRN
Start: 2020-04-20 — End: 2020-04-20
  Administered 2020-04-20 (×2): 1 via ORAL
  Filled 2020-04-20 (×2): qty 1

## 2020-04-20 MED ORDER — LACTATED RINGERS IV BOLUS
1000.0000 mL | Freq: Once | INTRAVENOUS | Status: AC
  Administered 2020-04-20: 1000 mL via INTRAVENOUS

## 2020-04-20 NOTE — ED Notes (Signed)
Pt assisted into wheelchair, states pain is worse in the R flank area, pain pill he was given over an hour ago not helping.

## 2020-04-20 NOTE — ED Triage Notes (Cosign Needed)
Emergency Medicine Provider Triage Evaluation Note  Kyle Johnson , a 49 y.o. male  was evaluated in triage.  Pt complains of right flank pain.  Patient presented to emergency department complaining of right flank pain.  No radiation.  No fever.  Patient states that he has had increased sensation for urination but slightly decreased urination.  Patient denies any nausea, vomiting, diarrhea or constipation.  He does have a history of kidney stones and reports that symptoms feel consistent with same..  Review of Systems  Positive: Right flank pain, slightly decreased urination Negative: Fevers, chills, URI symptoms, nausea, vomiting, diarrhea or constipation.  No hematuria.  Physical Exam  BP (!) 152/98 (BP Location: Left Arm)   Pulse (!) 104   Temp 98.1 F (36.7 C) (Oral)   Resp 18   Ht 5\' 9"  (1.753 m)   Wt 90.7 kg   SpO2 100%   BMI 29.53 kg/m  Gen:   Awake, no distress   HEENT:  Atraumatic  Resp:  Normal effort  Cardiac:  Normal rate and rhythm Abd:   Nondistended, nontender to palpation of the 4 quadrants.  No CVA tenderness MSK:   Moves extremities without difficulty  Neuro:  Speech clear   Medical Decision Making  Medically screening exam initiated at 5:14 PM.  Appropriate orders placed.  CHINO SARDO was informed that the remainder of the evaluation will be completed by another provider, this initial triage assessment does not replace that evaluation, and the importance of remaining in the ED until their evaluation is complete.  Clinical Impression  Right flank pain.  Patient presented to the emergency department complaining of right flank pain today.  History of nephrolithiasis with similar symptoms.  Patient is concerned that he has another kidney stone.  Last kidney stone passed without complication.  Patient has not seen any hematuria.  He does endorse some mild decreased urinary output.  He has no symptoms consistent with urinary retention.  At this time patient  will have urinalysis, basic labs, CT scan to evaluate for possible kidney stone.  Patient informed that this was a medical screening exam, that we would start a work-up and that he will be seen, treated likely by another provider when a room becomes available   Darletta Moll, PA-C 04/20/20 1717

## 2020-04-20 NOTE — ED Triage Notes (Signed)
Pt here for right flank pain. No fever. Has difficulty urinating.  No fever. Nausea with pain waves, no vomiting. Hx of stones, feels same.

## 2020-04-21 ENCOUNTER — Emergency Department
Admission: EM | Admit: 2020-04-21 | Discharge: 2020-04-21 | Disposition: A | Discharge status: Home / Self Care | Payer: BC Managed Care – PPO | Attending: Emergency Medicine | Admitting: Emergency Medicine

## 2020-04-21 ENCOUNTER — Encounter: Payer: Self-pay | Admitting: Emergency Medicine

## 2020-04-21 DIAGNOSIS — R109 Unspecified abdominal pain: Secondary | ICD-10-CM

## 2020-04-21 DIAGNOSIS — N201 Calculus of ureter: Secondary | ICD-10-CM

## 2020-04-21 DIAGNOSIS — K7689 Other specified diseases of liver: Secondary | ICD-10-CM

## 2020-04-21 HISTORY — DX: Eosinophilic colitis: K52.82

## 2020-04-21 HISTORY — DX: Personal history of urinary calculi: Z87.442

## 2020-04-21 MED ORDER — TAMSULOSIN HCL 0.4 MG PO CAPS: ORAL_CAPSULE | ORAL | 0 refills | Status: DC

## 2020-04-21 MED ORDER — OXYCODONE-ACETAMINOPHEN 5-325 MG PO TABS: 2.0000 | ORAL_TABLET | Freq: Four times a day (QID) | ORAL | 0 refills | Status: DC | PRN

## 2020-04-21 MED ORDER — ONDANSETRON 4 MG PO TBDP: ORAL_TABLET | ORAL | 0 refills | Status: DC

## 2020-04-21 MED ORDER — OXYCODONE-ACETAMINOPHEN 5-325 MG PO TABS
2.0000 | ORAL_TABLET | Freq: Once | ORAL | Status: AC
  Administered 2020-04-21: 2 via ORAL
  Filled 2020-04-21: qty 2

## 2020-04-21 MED ORDER — KETOROLAC TROMETHAMINE 30 MG/ML IJ SOLN
15.0000 mg | Freq: Once | INTRAMUSCULAR | Status: AC
  Administered 2020-04-21: 15 mg via INTRAVENOUS
  Filled 2020-04-21: qty 1

## 2020-04-21 MED ORDER — DOCUSATE SODIUM 100 MG PO CAPS: ORAL_CAPSULE | ORAL | 0 refills | Status: DC

## 2020-04-21 NOTE — Discharge Instructions (Addendum)
You have been seen in the Emergency Department (ED) today for pain caused by kidney stones.  As we have discussed, please drink plenty of fluids.  Please make a follow up appointment with the physician(s) listed elsewhere in this documentation.  You may take pain medication as needed but ONLY as prescribed.  Please also take your prescribed Flomax daily.  We also recommend that you take over-the-counter ibuprofen regularly according to label instructions over the next 5 days.  Take it with meals to minimize stomach discomfort.  Please see your doctor as soon as possible as stones may take 1-3 weeks to pass and you may require additional care or medications.  Do not drink alcohol, drive or participate in any other potentially dangerous activities while taking opiate pain medication as it may make you sleepy. Do not take this medication with any other sedating medications, either prescription or over-the-counter. If you were prescribed Percocet or Vicodin, do not take these with acetaminophen (Tylenol) as it is already contained within these medications.   Take Percocet as needed for severe pain.  This medication is an opiate (or narcotic) pain medication and can be habit forming.  Use it as little as possible to achieve adequate pain control.  Do not use or use it with extreme caution if you have a history of opiate abuse or dependence.  If you are on a pain contract with your primary care doctor or a pain specialist, be sure to let them know you were prescribed this medication today from the [facility] Emergency Department.  This medication is intended for your use only - do not give any to anyone else and keep it in a secure place where nobody else, especially children, have access to it.  It will also cause or worsen constipation, so you may want to consider taking an over-the-counter stool softener while you are taking this medication.  One incidental finding on your CT scan was that of a cyst on  your liver.  These are almost always benign and nothing to worry about, but the radiologist mentioned that you might want to discuss it with your primary care doctor to ask about possibly getting an outpatient MRI (hepatic protocol) to further evaluate it.  He said it is in a position that would be difficult to evaluate with an ultrasound.  I recommend you take this paperwork to your primary care doctor and show her this information so she can look up the CT results in the computer and talk to you about whether or not you need an outpatient MRI.  Return to the Emergency Department (ED) or call your doctor if you have any worsening pain, fever, painful urination, are unable to urinate, or develop other symptoms that concern you.

## 2020-04-21 NOTE — ED Provider Notes (Signed)
[facility] Emergency Department Provider Note  ____________________________________________   First MD Initiated Contact with Patient 04/21/20 0231     (approximate)  I have reviewed the triage vital signs and the nursing notes.   HISTORY  Chief Complaint Flank Pain    HPI Kyle Johnson is a 49 y.o. male with medical history as listed below which includes  a prior episode of a kidney stone on the left which he passed without requiring any surgical intervention. He presents for evaluation of acute onset and severe sharp and stabbing pain in his right flank. The pain started about 14 to 15 hours prior to my evaluation of the patient and he has been waiting in the emergency department waiting room for more than 10 hours due to overwhelming ED and hospital patient volumes. By now the pain is almost completely gone. He said that it was severe earlier and coming in waves. He received a Percocet in triage and then received some medicine of IV as well as a liter of fluids and now he feels much better. He was able to urinate without any discomfort although he was having burning dysuria earlier and urinary hesitancy. He denies fever/chills, sore throat, chest pain, shortness of breath. He had some nausea but no vomiting. Nothing particular made the symptoms better or worse but he is better now.        Past Medical History:  Diagnosis Date  . Eosinophilic colitis   . GERD (gastroesophageal reflux disease)   . Heart murmur   . History of kidney stones   . Hypertension     Patient Active Problem List   Diagnosis Date Noted  . Eosinophilic enteritis 45/80/9983  . Eosinophilic colitis 38/25/0539  . Stomach irritation   . Heartburn   . Special screening for malignant neoplasms, colon   . Benign neoplasm of ascending colon   . Diverticulosis of large intestine without diverticulitis   . Viral gastroenteritis 05/16/2017  . GERD (gastroesophageal reflux  disease) 05/16/2017  . Reaction to DTaP immunization 11/11/2016  . Right-sided chest wall pain 11/06/2016  . Allergic rhinitis 11/24/2012  . PURE HYPERCHOLESTEROLEMIA 12/14/2008  . Essential hypertension 11/18/2007  . HEART MURMUR, HX OF 11/18/2007    Past Surgical History:  Procedure Laterality Date  . COLONOSCOPY WITH PROPOFOL N/A 08/20/2017   Procedure: COLONOSCOPY WITH PROPOFOL;  Surgeon: Virgel Manifold, MD;  Location: [facility] ENDOSCOPY;  Service: Endoscopy;  Laterality: N/A;  . ESOPHAGOGASTRODUODENOSCOPY (EGD) WITH PROPOFOL N/A 08/20/2017   Procedure: ESOPHAGOGASTRODUODENOSCOPY (EGD) WITH PROPOFOL;  Surgeon: Virgel Manifold, MD;  Location: [facility] ENDOSCOPY;  Service: Endoscopy;  Laterality: N/A;    Prior to Admission medications   Medication Sig Start Date End Date Taking? Authorizing Provider  albuterol (PROVENTIL HFA;VENTOLIN HFA) 108 (90 Base) MCG/ACT inhaler Inhale 2 puffs into the lungs every 6 (six) hours as needed for wheezing or shortness of breath. 05/03/18   Jearld Fenton, NP  amLODipine (NORVASC) 5 MG tablet TAKE 1 TABLET (5 MG TOTAL) BY MOUTH DAILY. 04/17/20   Jinny Sanders, MD  docusate sodium (COLACE) 100 MG capsule Take 1 tablet once or twice daily as needed for constipation while taking narcotic pain medicine 04/21/20   Hinda Kehr, MD  ondansetron (ZOFRAN ODT) 4 MG disintegrating tablet Allow 1-2 tablets to dissolve in your mouth every 8 hours as needed for nausea/vomiting 04/21/20   Hinda Kehr, MD  oxyCODONE-acetaminophen (PERCOCET) 5-325 MG tablet Take 2 tablets by mouth every 6 (six) hours as  needed for severe pain. 04/21/20   Hinda Kehr, MD  tamsulosin (FLOMAX) 0.4 MG CAPS capsule Take 1 tablet by mouth daily until you pass the kidney stone or no longer have symptoms 04/21/20   Hinda Kehr, MD    Allergies Aspirin, Milk-related compounds, Other, Penicillins, Propoxyphene hcl, and Tetanus toxoids  Family History  Problem Relation Age of Onset  .  Hypertension Mother   . Stroke Mother   . Heart disease Maternal Grandfather   . Dementia Maternal Grandfather     Social History Social History   Tobacco Use  . Smoking status: Never Smoker  . Smokeless tobacco: Never Used  Vaping Use  . Vaping Use: Never used  Substance Use Topics  . Alcohol use: No  . Drug use: No    Review of Systems Constitutional: No fever/chills Eyes: No visual changes. ENT: No sore throat. Cardiovascular: Denies chest pain. Respiratory: Denies shortness of breath. Gastrointestinal: No abdominal pain.  Nausea, no vomiting.  No diarrhea.  No constipation. Genitourinary: Positive for dysuria and urinary hesitancy. Musculoskeletal: Right flank pain.  Integumentary: Negative for rash. Neurological: Negative for headaches, focal weakness or numbness.   ____________________________________________   PHYSICAL EXAM:  VITAL SIGNS: ED Triage Vitals  Enc Vitals Group     BP 04/20/20 1639 (!) 152/98     Pulse Rate 04/20/20 1639 (!) 104     Resp 04/20/20 1639 18     Temp 04/20/20 1639 98.1 F (36.7 C)     Temp Source 04/20/20 1639 Oral     SpO2 04/20/20 1639 100 %     Weight 04/20/20 1639 90.7 kg (200 lb)     Height 04/20/20 1639 1.753 m (5\' 9" )     Head Circumference --      Peak Flow --      Pain Score 04/20/20 1646 7     Pain Loc --      Pain Edu? --      Excl. in [place]? --     Constitutional: Alert and oriented.  Eyes: Conjunctivae are normal.  Head: Atraumatic. Nose: No congestion/rhinnorhea. Mouth/Throat: Patient is wearing a mask. Neck: No stridor.  No meningeal signs.   Cardiovascular: Normal rate, regular rhythm. Good peripheral circulation. Grossly normal heart sounds. Respiratory: Normal respiratory effort.  No retractions. Gastrointestinal: Soft and nontender. No distention.  Musculoskeletal: Mild right CVA tenderness to percussion. No lower extremity tenderness nor edema. No gross deformities of extremities. Neurologic:  Normal  speech and language. No gross focal neurologic deficits are appreciated.  Skin:  Skin is warm, dry and intact. Psychiatric: Mood and affect are normal. Speech and behavior are normal.  ____________________________________________   LABS (all labs ordered are listed, but only abnormal results are displayed)  Labs Reviewed  URINALYSIS, COMPLETE (UACMP) WITH MICROSCOPIC - Abnormal; Notable for the following components:      Result Value   Color, Urine YELLOW (*)    APPearance CLEAR (*)    All other components within normal limits  BASIC METABOLIC PANEL - Abnormal; Notable for the following components:   Potassium 3.4 (*)    Glucose, Bld 122 (*)    Creatinine, Ser 1.30 (*)    All other components within normal limits  CBC   ____________________________________________  EKG  No indication for emergent EKG ____________________________________________  RADIOLOGY Ursula Alert, personally viewed and evaluated these images (plain radiographs) as part of my medical decision making, as well as reviewing the written report by the radiologist.  ED MD interpretation:  3 x 4 mm obstructing stone at the right ureterovesicular junction with hydronephrosis.  Official radiology report(s): CT Renal Stone Study  Result Date: 04/20/2020 CLINICAL DATA:  Right flank pain. Kidney stone suspected. Urinary urgency. EXAM: CT ABDOMEN AND PELVIS WITHOUT CONTRAST TECHNIQUE: Multidetector CT imaging of the abdomen and pelvis was performed following the standard protocol without IV contrast. COMPARISON:  CT 11/10/2011 FINDINGS: Lower chest: The lung bases are clear. No pleural fluid or focal airspace disease. There is mild motion artifact. Hepatobiliary: Mild decreased hepatic density consistent with steatosis. There is a 13 mm low-density lesion in the left hepatic dome, series 2, image 12, typically cyst but slightly increased in size from prior exam where it was subcentimeter. Gallbladder physiologically  distended, no calcified stone. No biliary dilatation. Pancreas: No ductal dilatation or inflammation. Spleen: Normal in size without focal abnormality. Adrenals/Urinary Tract: Normal adrenal glands there is an obstructing 3 x 4 mm stone at the right ureterovesicular junction with moderate right hydroureteronephrosis. Mild right perinephric edema. No additional nonobstructing renal calculi in the right kidney. There are 2 tiny nonobstructing stones in the lower and mid upper left kidney. No left hydronephrosis. Nondistended urinary bladder with suggestion of wall thickening and mild perivesicular edema. Stomach/Bowel: Small hiatal hernia. Stomach distended with ingested contents. Normal positioning of the duodenum and ligament of Treitz. There is no small bowel obstruction or evidence of inflammation. Normal appendix. Occasional sigmoid colonic diverticula without diverticulitis. Vascular/Lymphatic: Normal caliber abdominal aorta. There are small mesenteric lymph nodes in the right abdomen with adjacent edema but are similar to prior exam. No enlarged lymph nodes in the abdomen or pelvis. Reproductive: Prostate is unremarkable. Other: No ascites or free air. Fat in both inguinal canals, left greater than right. There is a small fat containing umbilical hernia. Musculoskeletal: There are no acute or suspicious osseous abnormalities. IMPRESSION: 1. Obstructing 3 x 4 mm stone at the right ureterovesicular junction with moderate right hydroureteronephrosis. 2. Tiny nonobstructing stones in the left kidney. 3. Mild hepatic steatosis. A 13 mm low-density lesion in the left hepatic dome is likely a cyst but slightly increased in size from prior exam where it was subcentimeter. Consider further evaluation with hepatic protocol MRI on an elective basis if patient has a history of malignancy. This region would be difficult to evaluate on ultrasound. 4. Minimal colonic diverticulosis without diverticulitis. Electronically  Signed   By: Keith Rake M.D.   On: 04/20/2020 18:24    ____________________________________________   PROCEDURES   Procedure(s) performed (including Critical Care):  Procedures   ____________________________________________   INITIAL IMPRESSION / MDM / [place] / ED COURSE  As part of my medical decision making, I reviewed the following data within the [place] notes reviewed and incorporated, Labs reviewed , Old chart reviewed, Notes from prior ED visits and [place] Controlled Substance Database   Differential diagnosis includes, but is not limited to, kidney/ureteral stone, infected stone, UTI/pyelonephritis, renal ischemia.  Patient's lab work is all generally reassuring. He has a very slight elevation of his creatinine to 1.3 but he received a liter of normal saline and he is now able to tolerate oral intake. Urinalysis was unremarkable. Potassium level was only slightly decreased. Vital signs have been stable. The patient's pain has almost completely resolved. I had my usual and customary kidney stone discussion with the patient and I am providing prescriptions as listed below and gave my usual follow-up recommendations and return precautions. He understands and agrees with the  plan. I gave him 2 more Percocet and a dose of Toradol 15 mg IV prior to discharge.  Of note, I also provided extensive written information about the hepatic lesion/cyst seen on his CT scan including the radiology recommendations for outpatient follow-up with a hepatic protocol MRI.          ____________________________________________  FINAL CLINICAL IMPRESSION(S) / ED DIAGNOSES  Final diagnoses:  Right flank pain  Right ureteral stone  Hepatic cyst     MEDICATIONS GIVEN DURING THIS VISIT:  Medications  oxyCODONE-acetaminophen (PERCOCET/ROXICET) 5-325 MG per tablet 1 tablet (1 tablet Oral Given 04/20/20 1940)  ondansetron (ZOFRAN-ODT) disintegrating  tablet 4 mg (4 mg Oral Given 04/20/20 1743)  lactated ringers bolus 1,000 mL (0 mLs Intravenous Stopped 04/21/20 0237)  ketorolac (TORADOL) 30 MG/ML injection 15 mg (15 mg Intravenous Given 04/21/20 0249)  oxyCODONE-acetaminophen (PERCOCET/ROXICET) 5-325 MG per tablet 2 tablet (2 tablets Oral Given 04/21/20 0250)     ED Discharge Orders         Ordered    oxyCODONE-acetaminophen (PERCOCET) 5-325 MG tablet  Every 6 hours PRN        04/21/20 0243    ondansetron (ZOFRAN ODT) 4 MG disintegrating tablet        04/21/20 0243    tamsulosin (FLOMAX) 0.4 MG CAPS capsule        04/21/20 0243    docusate sodium (COLACE) 100 MG capsule        04/21/20 0243          *Please note:  Kyle Johnson was evaluated in Emergency Department on 04/21/2020 for the symptoms described in the history of present illness. He was evaluated in the context of the global COVID-19 pandemic, which necessitated consideration that the patient might be at risk for infection with the SARS-CoV-2 virus that causes COVID-19. Institutional protocols and algorithms that pertain to the evaluation of patients at risk for COVID-19 are in a state of rapid change based on information released by regulatory bodies including the CDC and federal and state organizations. These policies and algorithms were followed during the patient's care in the ED.  Some ED evaluations and interventions may be delayed as a result of limited staffing during and after the pandemic.*  Note:  This document was prepared using Dragon voice recognition software and may include unintentional dictation errors.   Hinda Kehr, MD 04/21/20 [phone]

## 2020-04-26 ENCOUNTER — Encounter: Payer: Self-pay | Admitting: Family Medicine

## 2020-04-26 ENCOUNTER — Other Ambulatory Visit: Payer: Self-pay

## 2020-04-26 ENCOUNTER — Ambulatory Visit: Payer: BC Managed Care – PPO | Admitting: Family Medicine

## 2020-04-26 VITALS — BP 130/84 | HR 67 | Temp 97.5°F | Ht 69.5 in | Wt 207.8 lb

## 2020-04-26 DIAGNOSIS — K769 Liver disease, unspecified: Secondary | ICD-10-CM

## 2020-04-26 NOTE — Progress Notes (Signed)
Chief Complaint  Patient presents with  . Follow-up    Kidney Stone CT  . Immunizations    Shingrix    History of Present Illness: HPI    49 year old male presents for follow up on kidney stone and incidental findings on CT renal study.  Seen at ER on 04/20/2020  He reports pain is resolved. No fever, NO dysuria. Back to normal urination.  IMPRESSION: 1. Obstructing 3 x 4 mm stone at the right ureterovesicular junction with moderate right hydroureteronephrosis. 2. Tiny nonobstructing stones in the left kidney. 3. Mild hepatic steatosis. A 13 mm low-density lesion in the left hepatic dome is likely a cyst but slightly increased in size from prior exam where it was subcentimeter. Consider further evaluation with hepatic protocol MRI on an elective basis if patient has a history of malignancy. This region would be difficult to evaluate on ultrasound. 4. Minimal colonic diverticulosis without diverticulitis.   He is no  smoker, no ETOH. No past hepatitis exposure.  No previous cancer elsewhere.  Wt Readings from Last 3 Encounters:  04/26/20 207 lb 12 oz (94.2 kg)  04/20/20 200 lb (90.7 kg)  06/02/19 200 lb 8 oz (90.9 kg)    This visit occurred during the SARS-CoV-2 public health emergency.  Safety protocols were in place, including screening questions prior to the visit, additional usage of staff PPE, and extensive cleaning of exam room while observing appropriate contact time as indicated for disinfecting solutions.   COVID 19 screen:  No recent travel or known exposure to COVID19 The patient denies respiratory symptoms of COVID 19 at this time. The importance of social distancing was discussed today.     Review of Systems  Constitutional: Negative for chills and fever.  HENT: Negative for congestion and ear pain.   Eyes: Negative for pain and redness.  Respiratory: Negative for cough and shortness of breath.   Cardiovascular: Negative for chest pain, palpitations and  leg swelling.  Gastrointestinal: Negative for abdominal pain, blood in stool, constipation, diarrhea, nausea and vomiting.  Genitourinary: Negative for dysuria.  Musculoskeletal: Negative for falls and myalgias.  Skin: Negative for rash.  Neurological: Negative for dizziness.  Psychiatric/Behavioral: Negative for depression. The patient is not nervous/anxious.       Past Medical History:  Diagnosis Date  . Eosinophilic colitis   . GERD (gastroesophageal reflux disease)   . Heart murmur   . History of kidney stones   . Hypertension     reports that he has never smoked. He has never used smokeless tobacco. He reports that he does not drink alcohol and does not use drugs.   Current Outpatient Medications:  .  amLODipine (NORVASC) 5 MG tablet, TAKE 1 TABLET (5 MG TOTAL) BY MOUTH DAILY., Disp: 90 tablet, Rfl: 0   Observations/Objective: Blood pressure 130/84, pulse 67, temperature (!) 97.5 F (36.4 C), temperature source Temporal, height 5' 9.5" (1.765 m), weight 207 lb 12 oz (94.2 kg), SpO2 96 %.  Physical Exam Constitutional:      Appearance: He is well-developed.  HENT:     Head: Normocephalic.     Right Ear: Hearing normal.     Left Ear: Hearing normal.     Nose: Nose normal.  Neck:     Thyroid: No thyroid mass or thyromegaly.     Vascular: No carotid bruit.     Trachea: Trachea normal.  Cardiovascular:     Rate and Rhythm: Normal rate and regular rhythm.     Pulses:  Normal pulses.     Heart sounds: Heart sounds not distant. No murmur heard.  No friction rub. No gallop.      Comments: No peripheral edema Pulmonary:     Effort: Pulmonary effort is normal. No respiratory distress.     Breath sounds: Normal breath sounds.  Skin:    General: Skin is warm and dry.     Findings: No rash.  Psychiatric:        Speech: Speech normal.        Behavior: Behavior normal.        Thought Content: Thought content normal.      Assessment and Plan   Hepatic  lesion Incidentally noted on CT for renal stoine.  Likely hepatic cyst... no history of malignancy... no clear indication for MRI.     Eliezer Lofts, MD

## 2020-04-26 NOTE — Patient Instructions (Addendum)
Work on exercise and weight loss.   I will send you a message about liver changes.

## 2020-04-30 ENCOUNTER — Ambulatory Visit: Payer: Self-pay | Admitting: Urology

## 2020-05-22 DIAGNOSIS — K769 Liver disease, unspecified: Secondary | ICD-10-CM | POA: Insufficient documentation

## 2020-05-22 NOTE — Assessment & Plan Note (Signed)
Incidentally noted on CT for renal stoine.  Likely hepatic cyst... no history of malignancy... no clear indication for MRI.

## 2020-06-13 ENCOUNTER — Other Ambulatory Visit: Payer: Self-pay

## 2020-06-13 ENCOUNTER — Ambulatory Visit (INDEPENDENT_AMBULATORY_CARE_PROVIDER_SITE_OTHER): Payer: BC Managed Care – PPO

## 2020-06-13 DIAGNOSIS — Z23 Encounter for immunization: Secondary | ICD-10-CM

## 2020-06-13 NOTE — Progress Notes (Signed)
Per orders of Dr. Diona Browner, injection of egg-free influenza given in left deltoid and 1st dose of shingrix given in right deltoid by Kem Parkinson. Patient tolerated injection well. Will make nurse visit for shingles #2.

## 2020-07-13 ENCOUNTER — Telehealth: Payer: Self-pay | Admitting: Family Medicine

## 2020-07-15 NOTE — Telephone Encounter (Signed)
Please schedule CPE with fasting labs prior with Dr. Bedsole.  

## 2020-07-17 NOTE — Telephone Encounter (Signed)
Spoke with patient scheduled CPE and labs 

## 2020-08-15 ENCOUNTER — Ambulatory Visit (INDEPENDENT_AMBULATORY_CARE_PROVIDER_SITE_OTHER): Payer: BC Managed Care – PPO

## 2020-08-15 DIAGNOSIS — Z23 Encounter for immunization: Secondary | ICD-10-CM

## 2020-08-26 ENCOUNTER — Telehealth: Payer: Self-pay | Admitting: Family Medicine

## 2020-08-26 DIAGNOSIS — Z125 Encounter for screening for malignant neoplasm of prostate: Secondary | ICD-10-CM

## 2020-08-26 DIAGNOSIS — E7800 Pure hypercholesterolemia, unspecified: Secondary | ICD-10-CM

## 2020-08-26 NOTE — Telephone Encounter (Signed)
-----   Message from Cloyd Stagers, RT sent at 08/12/2020  4:10 PM EST ----- Regarding: Lab Orders for Friday 1.14.2022 Please place lab orders for Friday 1.14.2022, office visit for physical on Friday 1.21.2022 Thank you, Dyke Maes RT(R)

## 2020-08-30 ENCOUNTER — Other Ambulatory Visit: Payer: Self-pay

## 2020-08-30 ENCOUNTER — Other Ambulatory Visit (INDEPENDENT_AMBULATORY_CARE_PROVIDER_SITE_OTHER): Payer: BC Managed Care – PPO

## 2020-08-30 DIAGNOSIS — Z125 Encounter for screening for malignant neoplasm of prostate: Secondary | ICD-10-CM

## 2020-08-30 DIAGNOSIS — E7800 Pure hypercholesterolemia, unspecified: Secondary | ICD-10-CM | POA: Diagnosis not present

## 2020-08-30 LAB — COMPREHENSIVE METABOLIC PANEL
ALT: 28 U/L (ref 0–53)
AST: 15 U/L (ref 0–37)
Albumin: 4.5 g/dL (ref 3.5–5.2)
Alkaline Phosphatase: 77 U/L (ref 39–117)
BUN: 18 mg/dL (ref 6–23)
CO2: 28 mEq/L (ref 19–32)
Calcium: 9.3 mg/dL (ref 8.4–10.5)
Chloride: 105 mEq/L (ref 96–112)
Creatinine, Ser: 1 mg/dL (ref 0.40–1.50)
GFR: 87.58 mL/min (ref >60.00)
Glucose, Bld: 99 mg/dL (ref 70–99)
Potassium: 4.2 mEq/L (ref 3.5–5.1)
Sodium: 141 mEq/L (ref 135–145)
Total Bilirubin: 0.6 mg/dL (ref 0.2–1.2)
Total Protein: 6.2 g/dL (ref 6.0–8.3)

## 2020-08-30 LAB — LIPID PANEL
Cholesterol: 186 mg/dL (ref 0–200)
HDL: 36.2 mg/dL — ABNORMAL LOW (ref >39.00)
LDL Cholesterol: 117 mg/dL — ABNORMAL HIGH (ref 0–99)
NonHDL: 149.37
Total CHOL/HDL Ratio: 5
Triglycerides: 162 mg/dL — ABNORMAL HIGH (ref 0.0–149.0)
VLDL: 32.4 mg/dL (ref 0.0–40.0)

## 2020-08-30 LAB — PSA: PSA: 0.49 ng/mL (ref 0.10–4.00)

## 2020-08-30 NOTE — Progress Notes (Signed)
No critical labs need to be addressed urgently. We will discuss labs in detail at upcoming office visit.   

## 2020-09-06 ENCOUNTER — Other Ambulatory Visit: Payer: Self-pay

## 2020-09-06 ENCOUNTER — Ambulatory Visit (INDEPENDENT_AMBULATORY_CARE_PROVIDER_SITE_OTHER): Payer: BC Managed Care – PPO | Admitting: Family Medicine

## 2020-09-06 VITALS — BP 138/84 | HR 99 | Temp 97.8°F | Ht 69.0 in | Wt 206.5 lb

## 2020-09-06 DIAGNOSIS — Z6830 Body mass index (BMI) 30.0-30.9, adult: Secondary | ICD-10-CM

## 2020-09-06 DIAGNOSIS — K219 Gastro-esophageal reflux disease without esophagitis: Secondary | ICD-10-CM

## 2020-09-06 DIAGNOSIS — I10 Essential (primary) hypertension: Secondary | ICD-10-CM

## 2020-09-06 DIAGNOSIS — E7800 Pure hypercholesterolemia, unspecified: Secondary | ICD-10-CM

## 2020-09-06 DIAGNOSIS — R7303 Prediabetes: Secondary | ICD-10-CM | POA: Diagnosis not present

## 2020-09-06 DIAGNOSIS — Z0000 Encounter for general adult medical examination without abnormal findings: Secondary | ICD-10-CM | POA: Diagnosis not present

## 2020-09-06 DIAGNOSIS — K5282 Eosinophilic colitis: Secondary | ICD-10-CM

## 2020-09-06 LAB — POCT GLYCOSYLATED HEMOGLOBIN (HGB A1C): Hemoglobin A1C: 5.5 % (ref 4.0–5.6)

## 2020-09-06 NOTE — Assessment & Plan Note (Signed)
Encouraged exercise, weight loss, healthy eating habits. ? ?

## 2020-09-06 NOTE — Assessment & Plan Note (Signed)
Stable control with diet.  

## 2020-09-06 NOTE — Assessment & Plan Note (Signed)
Stable, chronic.  Continue current medication.   Amlodipine 5 mg daily 

## 2020-09-06 NOTE — Assessment & Plan Note (Signed)
Stable control with diet changes

## 2020-09-06 NOTE — Patient Instructions (Signed)
Keep working on low fat, low cholesterol diet.  Increase exercise.Marland Kitchen 3-5 days a week cardiovascular exercsie.

## 2020-09-06 NOTE — Progress Notes (Signed)
Patient ID: Kyle Johnson, male    DOB: 1971-04-16, 50 y.o.   MRN: [id number]  This visit was conducted in person.  BP 138/84   Pulse 99   Temp 97.8 F (36.6 C) (Temporal)   Ht 5\' 9"  (1.753 m)   Wt 206 lb 8 oz (93.7 kg)   SpO2 99%   BMI 30.49 kg/m    CC:  Chief Complaint  Patient presents with  . Annual Exam    No concerns     Subjective:   HPI: Kyle Johnson is a 50 y.o. male presenting on 09/06/2020 for Annual Exam (No concerns )  The patient is here for annual wellness exam and preventative care.    Hypertension:   Good control on amlodipine 5 mg daily BP Readings from Last 3 Encounters:  09/06/20 138/84  04/26/20 130/84  04/21/20 (!) 146/86  Using medication without problems or lightheadedness:  none Chest pain with exertion:none Edema:none Short of breath: none Average home BPs:  Not checking Other issues:    Elevated Cholesterol: Tolerable control with lifestyle :The 10-year ASCVD risk score Kyle Johnson DC Brooke Bonito., et al., 2013) is: 5.9%   Values used to calculate the score:     Age: 45 years     Sex: Male     Is Non-Hispanic African American: No     Diabetic: No     Tobacco smoker: No     Systolic Blood Pressure: 026 mmHg     Is BP treated: Yes     HDL Cholesterol: 36.2 mg/dL     Total Cholesterol: 186 mg/dL Using medications without problems: Muscle aches:  Diet compliance: lots of carb, bread, but trying to decrease. Exercise: very active Other complaints:   Prediabetes: he has been working on decreasing sweets.   Eosinophilic colitis: Followed by GI Dr. Gerri Lins   No longer on steroid but it caused him to gain weight. Wt Readings from Last 3 Encounters:  09/06/20 206 lb 8 oz (93.7 kg)  04/26/20 207 lb 12 oz (94.2 kg)  04/20/20 200 lb (90.7 kg)      Relevant past medical, surgical, family and social history reviewed and updated as indicated. Interim medical history since our last visit reviewed. Allergies and medications reviewed and  updated. Outpatient Medications Prior to Visit  Medication Sig Dispense Refill  . amLODipine (NORVASC) 5 MG tablet TAKE 1 TABLET (5 MG TOTAL) BY MOUTH DAILY. 90 tablet 0   No facility-administered medications prior to visit.     Per HPI unless specifically indicated in ROS section below Review of Systems  Constitutional: Negative for fatigue and fever.  HENT: Negative for ear pain.   Eyes: Negative for pain.  Respiratory: Negative for cough and shortness of breath.   Cardiovascular: Negative for chest pain, palpitations and leg swelling.  Gastrointestinal: Negative for abdominal pain.  Genitourinary: Negative for dysuria.  Musculoskeletal: Negative for arthralgias.  Neurological: Negative for syncope, light-headedness and headaches.  Psychiatric/Behavioral: Negative for dysphoric mood.   Objective:  BP 138/84   Pulse 99   Temp 97.8 F (36.6 C) (Temporal)   Ht 5\' 9"  (1.753 m)   Wt 206 lb 8 oz (93.7 kg)   SpO2 99%   BMI 30.49 kg/m   Wt Readings from Last 3 Encounters:  09/06/20 206 lb 8 oz (93.7 kg)  04/26/20 207 lb 12 oz (94.2 kg)  04/20/20 200 lb (90.7 kg)      Physical Exam Constitutional:      General:  Vital signs are normal.     Appearance: He is well-developed and well-nourished. He is obese.  HENT:     Head: Normocephalic.     Right Ear: Hearing normal.     Left Ear: Hearing normal.     Nose: Nose normal.     Mouth/Throat:     Mouth: Oropharynx is clear and moist and mucous membranes are normal.  Neck:     Thyroid: No thyroid mass or thyromegaly.     Vascular: No carotid bruit.     Trachea: Trachea normal.  Cardiovascular:     Rate and Rhythm: Normal rate and regular rhythm.     Pulses: Normal pulses.     Heart sounds: Heart sounds not distant. No murmur heard. No friction rub. No gallop.      Comments: No peripheral edema Pulmonary:     Effort: Pulmonary effort is normal. No respiratory distress.     Breath sounds: Normal breath sounds.  Skin:     General: Skin is warm, dry and intact.     Findings: No rash.  Psychiatric:        Mood and Affect: Mood and affect normal.        Speech: Speech normal.        Behavior: Behavior normal.        Thought Content: Thought content normal.       Results for orders placed or performed in visit on 08/30/20  PSA  Result Value Ref Range   PSA 0.49 0.10 - 4.00 ng/mL  Comprehensive metabolic panel  Result Value Ref Range   Sodium 141 135 - 145 mEq/L   Potassium 4.2 3.5 - 5.1 mEq/L   Chloride 105 96 - 112 mEq/L   CO2 28 19 - 32 mEq/L   Glucose, Bld 99 70 - 99 mg/dL   BUN 18 6 - 23 mg/dL   Creatinine, Ser 1.00 0.40 - 1.50 mg/dL   Total Bilirubin 0.6 0.2 - 1.2 mg/dL   Alkaline Phosphatase 77 39 - 117 U/L   AST 15 0 - 37 U/L   ALT 28 0 - 53 U/L   Total Protein 6.2 6.0 - 8.3 g/dL   Albumin 4.5 3.5 - 5.2 g/dL   GFR 87.58 >60.00 mL/min   Calcium 9.3 8.4 - 10.5 mg/dL  Lipid panel  Result Value Ref Range   Cholesterol 186 0 - 200 mg/dL   Triglycerides 162.0 (H) 0.0 - 149.0 mg/dL   HDL 36.20 (L) >39.00 mg/dL   VLDL 32.4 0.0 - 40.0 mg/dL   LDL Cholesterol 117 (H) 0 - 99 mg/dL   Total CHOL/HDL Ratio 5    NonHDL 149.37     This visit occurred during the SARS-CoV-2 public health emergency.  Safety protocols were in place, including screening questions prior to the visit, additional usage of staff PPE, and extensive cleaning of exam room while observing appropriate contact time as indicated for disinfecting solutions.   COVID 19 screen:  No recent travel or known exposure to COVID19 The patient denies respiratory symptoms of COVID 19 at this time. The importance of social distancing was discussed today.   Assessment and Plan The patient's preventative maintenance and recommended screening tests for an annual wellness exam were reviewed in full today. Brought up to date unless services declined.  Counselled on the importance of diet, exercise, and its role in overall health and  mortality. The patient's FH and SH was reviewed, including their home life, tobacco status, and drug and  alcohol status.   Vaccines:uptodate with flu, Shingrix x 2, Tdap.  COVID JJ x 1 in 10/2019.. had significant SE after vaccine for COVID Prostate Cancer Screen:no early fam history  Lab Results  Component Value Date   PSA 0.49 08/30/2020   Colon Cancer Screen:no early fam history,  2019.. repeat in 5 years      Smoking Status:none ETOH/ drug BDZ:HGDJ HIV screen:refused.  Problem List Items Addressed This Visit    Eosinophilic colitis (Chronic)    Stable control with diet.      Essential hypertension (Chronic)    Stable, chronic.  Continue current medication.   Amlodipine 5 mg daily      GERD (gastroesophageal reflux disease) (Chronic)    Stable control with diet changes      Prediabetes   Relevant Orders   HgB A1c   Pure hypercholesterolemia (Chronic)    Encouraged exercise, weight loss, healthy eating habits.        Other Visit Diagnoses    Routine general medical examination at a health care facility    -  Primary          Eliezer Lofts, MD

## 2020-10-14 ENCOUNTER — Other Ambulatory Visit: Payer: Self-pay | Admitting: Family Medicine

## 2020-10-31 ENCOUNTER — Other Ambulatory Visit: Payer: Self-pay

## 2020-10-31 ENCOUNTER — Encounter: Payer: Self-pay | Admitting: Family Medicine

## 2020-10-31 ENCOUNTER — Ambulatory Visit: Payer: BC Managed Care – PPO | Admitting: Family Medicine

## 2020-10-31 VITALS — BP 120/80 | HR 110 | Temp 97.8°F | Ht 69.0 in | Wt 200.2 lb

## 2020-10-31 DIAGNOSIS — F411 Generalized anxiety disorder: Secondary | ICD-10-CM | POA: Diagnosis not present

## 2020-10-31 DIAGNOSIS — K5282 Eosinophilic colitis: Secondary | ICD-10-CM

## 2020-10-31 MED ORDER — HYDROXYZINE HCL 10 MG PO TABS: 10.0000 mg | ORAL_TABLET | Freq: Three times a day (TID) | ORAL | 0 refills | Status: DC | PRN

## 2020-10-31 MED ORDER — SERTRALINE HCL 50 MG PO TABS: 50.0000 mg | ORAL_TABLET | Freq: Every day | ORAL | 3 refills | Status: DC

## 2020-10-31 NOTE — Assessment & Plan Note (Signed)
?   Triggering and worsening diarrhea. Start SSRI.Marland Kitchen sertraline 50 mg daily at bedtime.  Will also try hydroxizine TID prn. ( added benefit of SE of constipation may help diarrhea as well as decrease anxiety associated)

## 2020-10-31 NOTE — Assessment & Plan Note (Signed)
?   If flare of this vs diarrhea related to anxiety.  Using hydroxyzine may be helpful in this setting as well.

## 2020-10-31 NOTE — Progress Notes (Signed)
Patient ID: Kyle Johnson, male    DOB: 03-03-1971, 50 y.o.   MRN: [id number]  This visit was conducted in person.  BP 120/80   Pulse (!) 110   Temp 97.8 F (36.6 C) (Temporal)   Ht 5\' 9"  (1.753 m)   Wt 200 lb 4 oz (90.8 kg)   SpO2 95%   BMI 29.57 kg/m    CC:  Chief Complaint  Patient presents with  . Diarrhea    Subjective:   HPI: Kyle Johnson is a 50 y.o. male presenting on 10/31/2020 for Diarrhea   He has history of eosinophilic colitis and GERD.Marland Kitchen with improved with watching diet closely.   He has noted a flare of symptoms after fried  Fish and shrimp... the night later diarrhea ( watery stool, no blood)  Cramping in lower abdomen.  He has been feeling tired lately as well for the last 4-5 days.  This morning he has had a decrease  Cramping and  Diarrhea.  When he is having diarrhea flare.. he has associated anxiety. Sometime anxiety proceeds his GI issue.. ? Causing.  He has day of lack of motivation.  No SI, no HI.  he is restless and worries a lot.  Was out 3 days from work.  No particular stressors.    [facility] Visit from 09/06/2020 in [place] at [facility] Total Score 1     See PHQ9 and GAD7 performed today in office.   Relevant past medical, surgical, family and social history reviewed and updated as indicated. Interim medical history since our last visit reviewed. Allergies and medications reviewed and updated. Outpatient Medications Prior to Visit  Medication Sig Dispense Refill  . amLODipine (NORVASC) 5 MG tablet TAKE 1 TABLET (5 MG TOTAL) BY MOUTH DAILY. 90 tablet 3   No facility-administered medications prior to visit.     Per HPI unless specifically indicated in ROS section below Review of Systems  Constitutional: Negative for fatigue and fever.  HENT: Negative for ear pain.   Eyes: Negative for pain.  Respiratory: Negative for cough and shortness of breath.   Cardiovascular: Negative for chest  pain, palpitations and leg swelling.  Gastrointestinal: Negative for abdominal pain.  Genitourinary: Negative for dysuria.  Musculoskeletal: Negative for arthralgias.  Neurological: Negative for syncope, light-headedness and headaches.  Psychiatric/Behavioral: Negative for dysphoric mood.   Objective:  BP 120/80   Pulse (!) 110   Temp 97.8 F (36.6 C) (Temporal)   Ht 5\' 9"  (1.753 m)   Wt 200 lb 4 oz (90.8 kg)   SpO2 95%   BMI 29.57 kg/m   Wt Readings from Last 3 Encounters:  10/31/20 200 lb 4 oz (90.8 kg)  09/06/20 206 lb 8 oz (93.7 kg)  04/26/20 207 lb 12 oz (94.2 kg)      Physical Exam Constitutional:      Appearance: He is well-developed.  HENT:     Head: Normocephalic.     Right Ear: Hearing normal.     Left Ear: Hearing normal.     Nose: Nose normal.  Neck:     Thyroid: No thyroid mass or thyromegaly.     Vascular: No carotid bruit.     Trachea: Trachea normal.  Cardiovascular:     Rate and Rhythm: Normal rate and regular rhythm.     Pulses: Normal pulses.     Heart sounds: Heart sounds not distant. No murmur heard. No friction rub. No gallop.  Comments: No peripheral edema Pulmonary:     Effort: Pulmonary effort is normal. No respiratory distress.     Breath sounds: Normal breath sounds.  Abdominal:     Tenderness: There is generalized abdominal tenderness.  Skin:    General: Skin is warm and dry.     Findings: No rash.  Psychiatric:        Speech: Speech normal.        Behavior: Behavior normal.        Thought Content: Thought content normal.       Results for orders placed or performed in visit on 09/06/20  HgB A1c  Result Value Ref Range   Hemoglobin A1C 5.5 4.0 - 5.6 %   HbA1c POC (<> result, manual entry)     HbA1c, POC (prediabetic range)     HbA1c, POC (controlled diabetic range)      This visit occurred during the SARS-CoV-2 public health emergency.  Safety protocols were in place, including screening questions prior to the visit,  additional usage of staff PPE, and extensive cleaning of exam room while observing appropriate contact time as indicated for disinfecting solutions.   COVID 19 screen:  No recent travel or known exposure to COVID19 The patient denies respiratory symptoms of COVID 19 at this time. The importance of social distancing was discussed today.   Assessment and Plan    Problem List Items Addressed This Visit    Eosinophilic colitis - Primary (Chronic)     ? If flare of this vs diarrhea related to anxiety.  Using hydroxyzine may be helpful in this setting as well.       GAD (generalized anxiety disorder)    ? Triggering and worsening diarrhea. Start SSRI.Marland Kitchen sertraline 50 mg daily at bedtime.  Will also try hydroxizine TID prn. ( added benefit of SE of constipation may help diarrhea as well as decrease anxiety associated)      Relevant Medications   sertraline (ZOLOFT) 50 MG tablet   hydrOXYzine (ATARAX/VISTARIL) 10 MG tablet      Eliezer Lofts, MD

## 2020-10-31 NOTE — Patient Instructions (Signed)
Start sertraline at bedtime.  If panic attack or spell of anxiety.. can use hydroxyzine as needed up to three times daily.

## 2020-11-22 ENCOUNTER — Other Ambulatory Visit: Payer: Self-pay | Admitting: Family Medicine

## 2021-02-19 ENCOUNTER — Other Ambulatory Visit: Payer: Self-pay | Admitting: Family Medicine

## 2021-02-19 NOTE — Telephone Encounter (Signed)
Last office visit 10/31/2020 for diarrhea & GAD.  Last refilled 11/22/2020 for #90 with no refills.  AVS at that visit states to return in about 4 weeks (around 11/28/2020) for Follow up mood.  No future appointments.  Refill?

## 2021-04-04 IMAGING — CT CT RENAL STONE PROTOCOL
2 of 4 series · 15 of 46 positions shown, 17 images · non-contrast
Comparison: CT 11/10/2011

CLINICAL DATA: Right flank pain. Kidney stone suspected. Urinary
urgency.

EXAM:
CT ABDOMEN AND PELVIS WITHOUT CONTRAST
TECHNIQUE: Multidetector CT imaging of the abdomen and pelvis was performed
following the standard protocol without IV contrast.

[Series 2: stone full standard · axial · 0.81mm/px · z∈[-528,-78]mm · 12 of 104 slices shown, 14 images]
[im 9/104  soft-tissue]
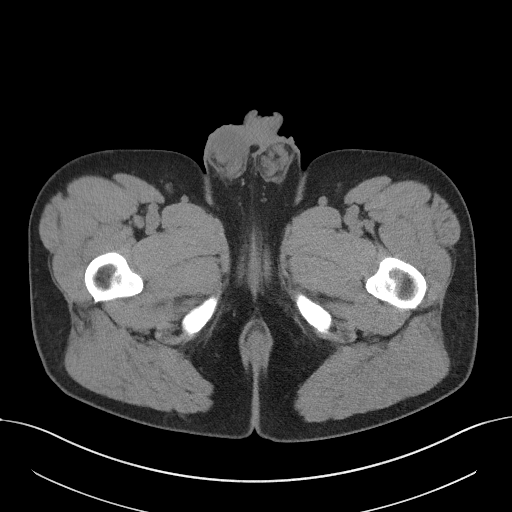
[im 9/104  bone]
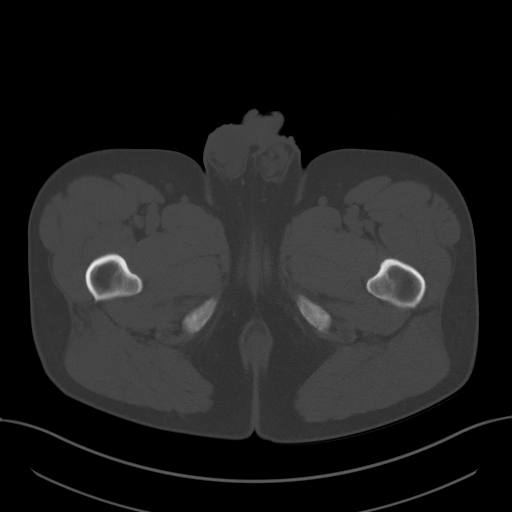
[im 17/104  soft-tissue]
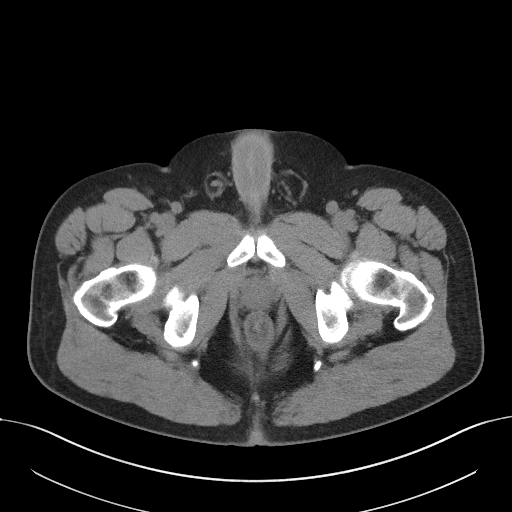
[im 25/104  soft-tissue]
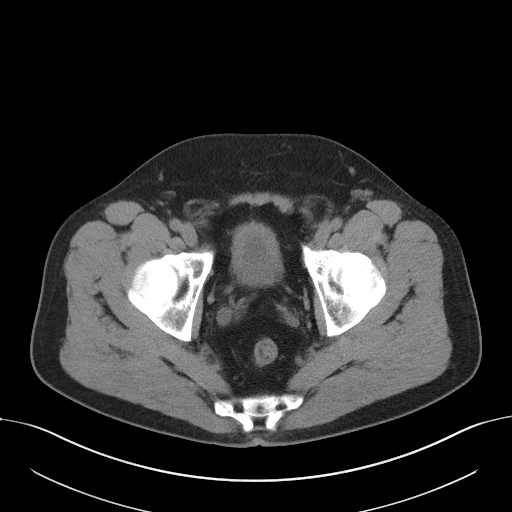
[im 33/104  soft-tissue]
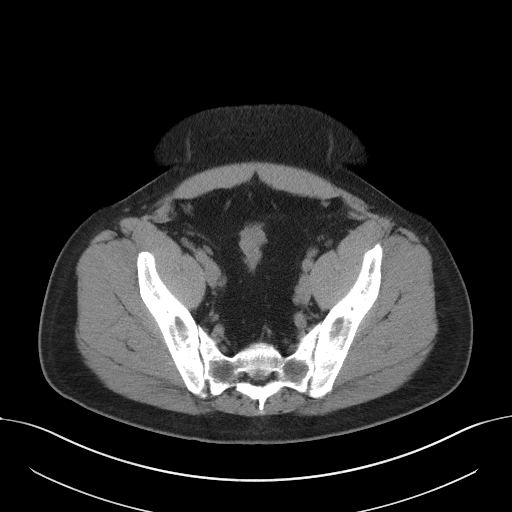
[im 42/104  soft-tissue]
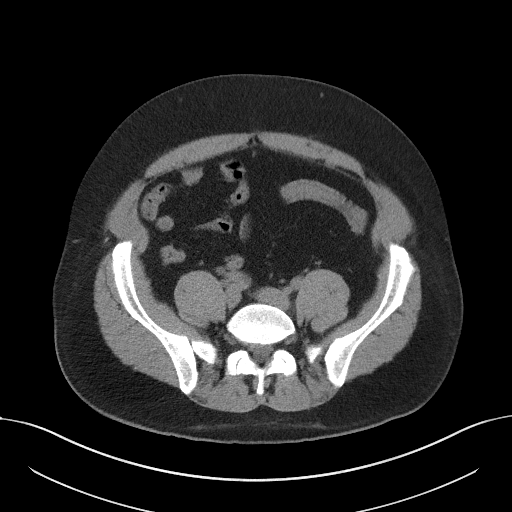
[im 50/104  soft-tissue]
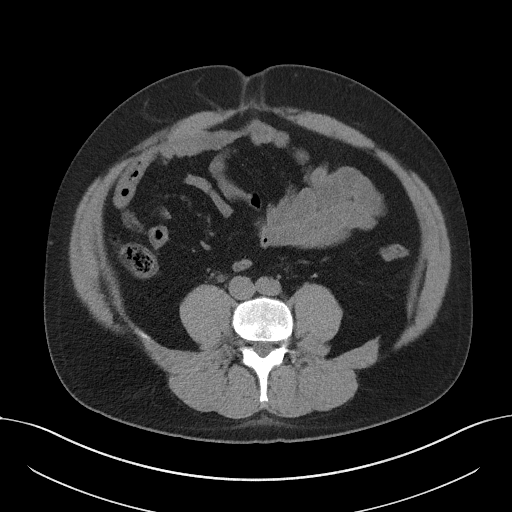
[im 58/104  soft-tissue]
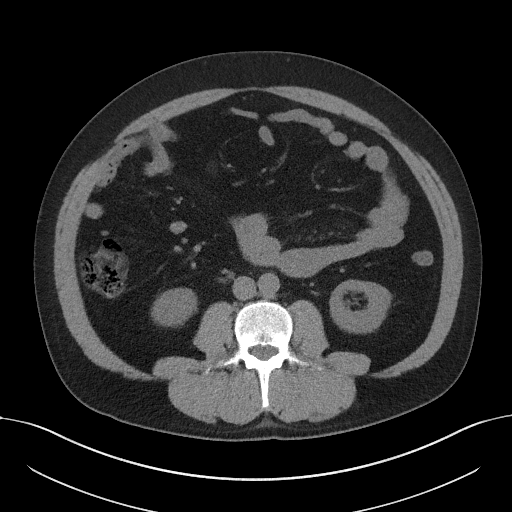
[im 66/104  soft-tissue]
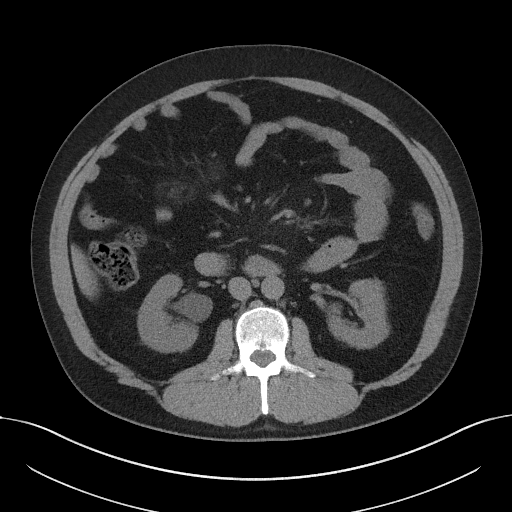
[im 75/104  soft-tissue]
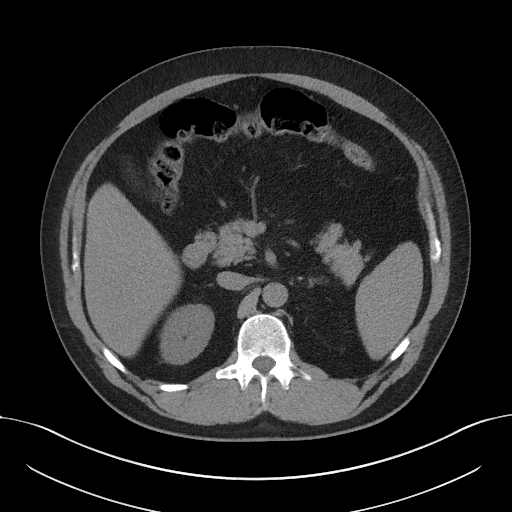
[im 75/104  bone]
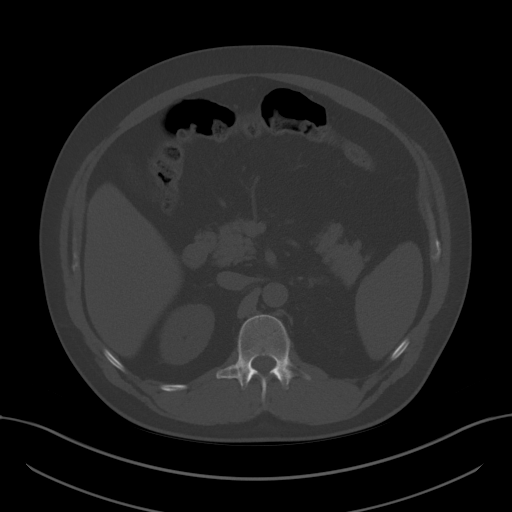
[im 83/104  soft-tissue]
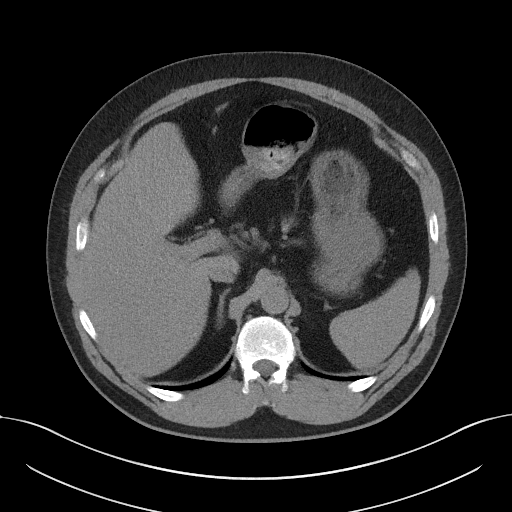
[im 91/104  soft-tissue]
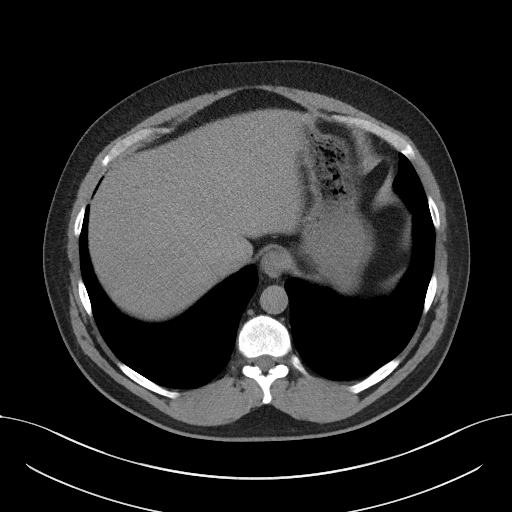
[im 99/104  soft-tissue]
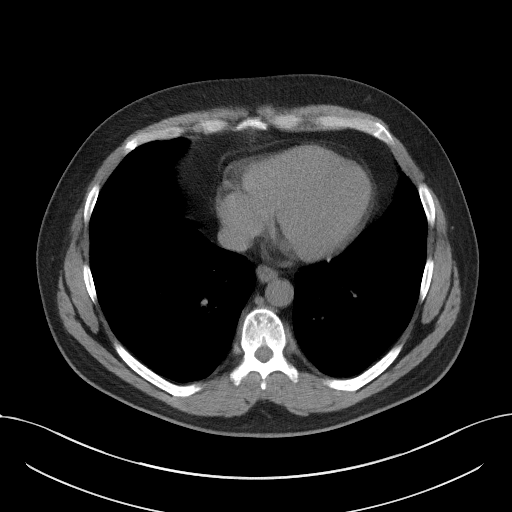

[Series 5: coronal · coronal · 0.74mm/px · 3 of 165 slices shown]
[im 55/165  soft-tissue]
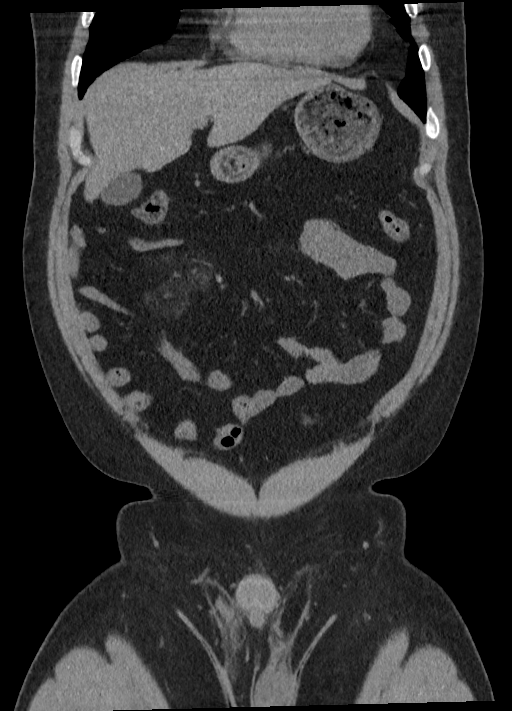
[im 73/165  soft-tissue]
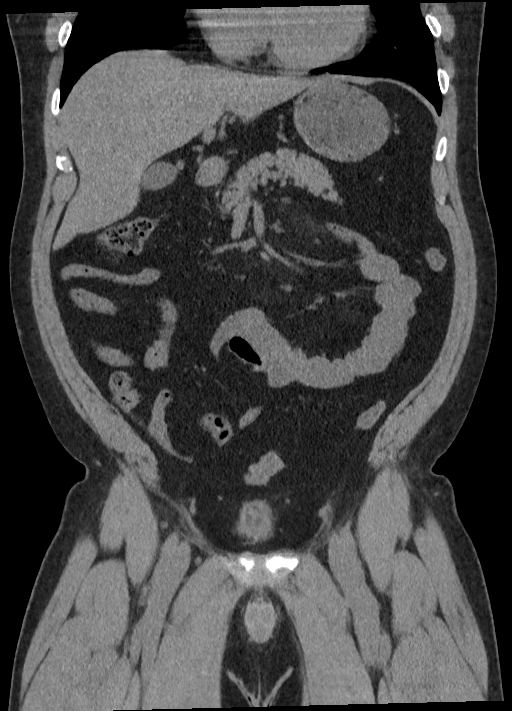
[im 92/165  soft-tissue]
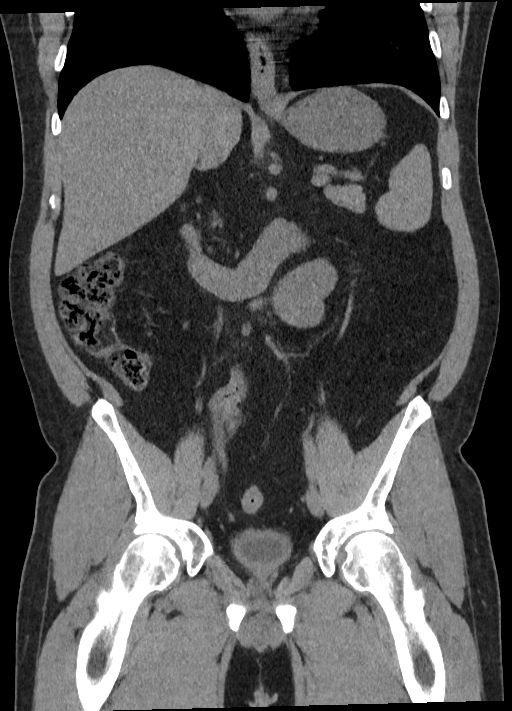

[15 of 46 positions shown; findings below may reference images not displayed]

FINDINGS: Lower chest: The lung bases are clear. No pleural fluid or focal
airspace disease. There is mild motion artifact.

Hepatobiliary: Mild decreased hepatic density consistent with
steatosis. There is a 13 mm low-density lesion in the left hepatic
dome, series 2, image 12, typically cyst but slightly increased in
size from prior exam where it was subcentimeter. Gallbladder
physiologically distended, no calcified stone. No biliary
dilatation.

Pancreas: No ductal dilatation or inflammation.

Spleen: Normal in size without focal abnormality.

Adrenals/Urinary Tract: Normal adrenal glands there is an
obstructing 3 x 4 mm stone at the right ureterovesicular junction
with moderate right hydroureteronephrosis. Mild right perinephric
edema. No additional nonobstructing renal calculi in the right
kidney. There are 2 tiny nonobstructing stones in the lower and mid
upper left kidney. No left hydronephrosis. Nondistended urinary
bladder with suggestion of wall thickening and mild perivesicular
edema.

Stomach/Bowel: Small hiatal hernia. Stomach distended with ingested
contents. Normal positioning of the duodenum and ligament of Treitz.
There is no small bowel obstruction or evidence of inflammation.
Normal appendix. Occasional sigmoid colonic diverticula without
diverticulitis.

Vascular/Lymphatic: Normal caliber abdominal aorta. There are small
mesenteric lymph nodes in the right abdomen with adjacent edema but
are similar to prior exam. No enlarged lymph nodes in the abdomen or
pelvis.

Reproductive: Prostate is unremarkable.

Other: No ascites or free air. Fat in both inguinal canals, left
greater than right. There is a small fat containing umbilical
hernia.

Musculoskeletal: There are no acute or suspicious osseous
abnormalities.
IMPRESSION: 1. Obstructing 3 x 4 mm stone at the right ureterovesicular junction
with moderate right hydroureteronephrosis.
2. Tiny nonobstructing stones in the left kidney.
3. Mild hepatic steatosis. A 13 mm low-density lesion in the left
hepatic dome is likely a cyst but slightly increased in size from
prior exam where it was subcentimeter. Consider further evaluation
with hepatic protocol MRI on an elective basis if patient has a
history of malignancy. This region would be difficult to evaluate on
ultrasound.
4. Minimal colonic diverticulosis without diverticulitis.

## 2021-05-23 ENCOUNTER — Other Ambulatory Visit: Payer: Self-pay | Admitting: Family Medicine

## 2021-10-11 ENCOUNTER — Other Ambulatory Visit: Payer: Self-pay | Admitting: Family Medicine

## 2021-10-13 NOTE — Telephone Encounter (Signed)
Please schedule CPE with fasting labs prior for Dr. Bedsole. 

## 2021-10-14 NOTE — Telephone Encounter (Signed)
Reached out to pt and scheduled phy and lab.

## 2021-11-19 ENCOUNTER — Telehealth: Payer: Self-pay | Admitting: Family Medicine

## 2021-11-19 DIAGNOSIS — R7303 Prediabetes: Secondary | ICD-10-CM

## 2021-11-19 DIAGNOSIS — Z125 Encounter for screening for malignant neoplasm of prostate: Secondary | ICD-10-CM

## 2021-11-19 DIAGNOSIS — E7800 Pure hypercholesterolemia, unspecified: Secondary | ICD-10-CM

## 2021-11-19 NOTE — Telephone Encounter (Signed)
-----   Message from Velna Hatchet, RT sent at 11/17/2021 10:43 AM EDT ----- ?Regarding: Lab orders needed for appt on Friday, 12/05/2021 ?Patient is scheduled for cpx, please order future labs.  Thanks, Anda Kraft  ? ?

## 2021-12-05 ENCOUNTER — Other Ambulatory Visit (INDEPENDENT_AMBULATORY_CARE_PROVIDER_SITE_OTHER): Payer: BC Managed Care – PPO

## 2021-12-05 DIAGNOSIS — Z125 Encounter for screening for malignant neoplasm of prostate: Secondary | ICD-10-CM | POA: Diagnosis not present

## 2021-12-05 DIAGNOSIS — E7800 Pure hypercholesterolemia, unspecified: Secondary | ICD-10-CM | POA: Diagnosis not present

## 2021-12-05 DIAGNOSIS — R7303 Prediabetes: Secondary | ICD-10-CM | POA: Diagnosis not present

## 2021-12-05 LAB — COMPREHENSIVE METABOLIC PANEL
ALT: 22 U/L (ref 0–53)
AST: 15 U/L (ref 0–37)
Albumin: 4.2 g/dL (ref 3.5–5.2)
Alkaline Phosphatase: 83 U/L (ref 39–117)
BUN: 14 mg/dL (ref 6–23)
CO2: 29 mEq/L (ref 19–32)
Calcium: 8.8 mg/dL (ref 8.4–10.5)
Chloride: 103 mEq/L (ref 96–112)
Creatinine, Ser: 0.99 mg/dL (ref 0.40–1.50)
GFR: 87.85 mL/min (ref >60.00)
Glucose, Bld: 96 mg/dL (ref 70–99)
Potassium: 4 mEq/L (ref 3.5–5.1)
Sodium: 139 mEq/L (ref 135–145)
Total Bilirubin: 0.9 mg/dL (ref 0.2–1.2)
Total Protein: 6.1 g/dL (ref 6.0–8.3)

## 2021-12-05 LAB — LIPID PANEL
Cholesterol: 177 mg/dL (ref 0–200)
HDL: 32.8 mg/dL — ABNORMAL LOW (ref >39.00)
LDL Cholesterol: 112 mg/dL — ABNORMAL HIGH (ref 0–99)
NonHDL: 143.86
Total CHOL/HDL Ratio: 5
Triglycerides: 159 mg/dL — ABNORMAL HIGH (ref 0.0–149.0)
VLDL: 31.8 mg/dL (ref 0.0–40.0)

## 2021-12-05 LAB — HEMOGLOBIN A1C: Hgb A1c MFr Bld: 5.6 % (ref 4.6–6.5)

## 2021-12-05 LAB — PSA: PSA: 0.3 ng/mL (ref 0.10–4.00)

## 2021-12-05 NOTE — Progress Notes (Signed)
No critical labs need to be addressed urgently. We will discuss labs in detail at upcoming office visit.   

## 2021-12-12 ENCOUNTER — Ambulatory Visit (INDEPENDENT_AMBULATORY_CARE_PROVIDER_SITE_OTHER): Payer: BC Managed Care – PPO | Admitting: Family Medicine

## 2021-12-12 VITALS — BP 110/72 | HR 75 | Temp 98.0°F | Ht 69.5 in | Wt 202.0 lb

## 2021-12-12 DIAGNOSIS — F411 Generalized anxiety disorder: Secondary | ICD-10-CM

## 2021-12-12 DIAGNOSIS — I10 Essential (primary) hypertension: Secondary | ICD-10-CM | POA: Diagnosis not present

## 2021-12-12 DIAGNOSIS — K5282 Eosinophilic colitis: Secondary | ICD-10-CM

## 2021-12-12 DIAGNOSIS — E7800 Pure hypercholesterolemia, unspecified: Secondary | ICD-10-CM | POA: Diagnosis not present

## 2021-12-12 DIAGNOSIS — R5383 Other fatigue: Secondary | ICD-10-CM | POA: Diagnosis not present

## 2021-12-12 DIAGNOSIS — L219 Seborrheic dermatitis, unspecified: Secondary | ICD-10-CM

## 2021-12-12 DIAGNOSIS — Z0000 Encounter for general adult medical examination without abnormal findings: Secondary | ICD-10-CM | POA: Diagnosis not present

## 2021-12-12 LAB — CBC WITH DIFFERENTIAL/PLATELET
Basophils Absolute: 0.1 10*3/uL (ref 0.0–0.1)
Basophils Relative: 1.3 % (ref 0.0–3.0)
Eosinophils Absolute: 0.2 10*3/uL (ref 0.0–0.7)
Eosinophils Relative: 2.7 % (ref 0.0–5.0)
HCT: 45.8 % (ref 39.0–52.0)
Hemoglobin: 15.2 g/dL (ref 13.0–17.0)
Lymphocytes Relative: 21.2 % (ref 12.0–46.0)
Lymphs Abs: 1.4 10*3/uL (ref 0.7–4.0)
MCHC: 33.2 g/dL (ref 30.0–36.0)
MCV: 83.4 fl (ref 78.0–100.0)
Monocytes Absolute: 0.5 10*3/uL (ref 0.1–1.0)
Monocytes Relative: 7.5 % (ref 3.0–12.0)
Neutro Abs: 4.5 10*3/uL (ref 1.4–7.7)
Neutrophils Relative %: 67.3 % (ref 43.0–77.0)
Platelets: 205 10*3/uL (ref 150.0–400.0)
RBC: 5.49 Mil/uL (ref 4.22–5.81)
RDW: 13.3 % (ref 11.5–15.5)
WBC: 6.7 10*3/uL (ref 4.0–10.5)

## 2021-12-12 LAB — VITAMIN D 25 HYDROXY (VIT D DEFICIENCY, FRACTURES): VITD: 12.83 ng/mL — ABNORMAL LOW (ref 30.00–100.00)

## 2021-12-12 LAB — T3, FREE: T3, Free: 3.5 pg/mL (ref 2.3–4.2)

## 2021-12-12 LAB — T4, FREE: Free T4: 0.83 ng/dL (ref 0.60–1.60)

## 2021-12-12 LAB — VITAMIN B12: Vitamin B-12: 237 pg/mL (ref 211–911)

## 2021-12-12 LAB — TSH: TSH: 1.23 u[IU]/mL (ref 0.35–5.50)

## 2021-12-12 MED ORDER — KETOCONAZOLE 2 % EX CREA
1.0000 | TOPICAL_CREAM | Freq: Every day | CUTANEOUS | 0 refills | Status: DC
Start: 2021-12-12 — End: 2023-03-12

## 2021-12-12 NOTE — Patient Instructions (Addendum)
Please stop at the lab to have labs drawn. ? If negative, we can consider sleep testing. ? Can use topical ketoconazole cream for  seborrheic dermatitis ?

## 2021-12-12 NOTE — Progress Notes (Signed)
Patient ID: Kyle Johnson, male    DOB: [birth year], 51 y.o.   MRN: [id number]  This visit was conducted in person.  BP 110/72   Pulse 75   Temp 98 F (36.7 C) (Oral)   Ht 5' 9.5" (1.765 m)   Wt 202 lb (91.6 kg)   SpO2 96%   BMI 29.40 kg/m    CC:  Chief Complaint  Patient presents with   Annual Exam    Subjective:   HPI: Kyle Johnson is a 51 y.o. male presenting on 12/12/2021 for Annual Exam   Limited diet given eosinophilic colitis. He has been very tired in last few months.   Reviewed labs in detail with patient   GAD: Stable control on sertraline 50 mg daily , hydroxyzine prn. [place] Visit from 12/12/2021 in [place] at [facility] Total Score 0      Sleeping 5-8 houra night.    Hypertension:    Good control on amlodipine 5 mg daily BP Readings from Last 3 Encounters:  12/12/21 110/72  10/31/20 120/80  09/06/20 138/84  Using medication without problems or lightheadedness:  Chest pain with exertion: Edema: Short of breath: Average home BPs: Other issues:  Elevated Cholesterol:  Lab Results  Component Value Date   CHOL 177 12/05/2021   HDL 32.80 (L) 12/05/2021   LDLCALC 112 (H) 12/05/2021   TRIG 159.0 (H) 12/05/2021   CHOLHDL 5 12/05/2021  Using medications without problems: The 10-year ASCVD risk score (Arnett DK, et al., 2019) is: 4.9%   Values used to calculate the score:     Age: 50 years     Sex: Male     Is Non-Hispanic African American: No     Diabetic: No     Tobacco smoker: No     Systolic Blood Pressure: 671 mmHg     Is BP treated: Yes     HDL Cholesterol: 32.8 mg/dL     Total Cholesterol: 177 mg/dL Muscle aches:  Diet compliance: limited Exercise: walking at work Other complaints:         Relevant past medical, surgical, family and social history reviewed and updated as indicated. Interim medical history since our last visit reviewed. Allergies and medications reviewed and  updated. Outpatient Medications Prior to Visit  Medication Sig Dispense Refill   amLODipine (NORVASC) 5 MG tablet TAKE 1 TABLET (5 MG TOTAL) BY MOUTH DAILY. 90 tablet 0   hydrOXYzine (ATARAX/VISTARIL) 10 MG tablet Take 1 tablet (10 mg total) by mouth 3 (three) times daily as needed. 30 tablet 0   levocetirizine (XYZAL) 5 MG tablet SMARTSIG:1 Tablet(s) By Mouth Every Evening     sertraline (ZOLOFT) 50 MG tablet TAKE 1 TABLET BY MOUTH EVERY DAY 90 tablet 2   No facility-administered medications prior to visit.     Per HPI unless specifically indicated in ROS section below Review of Systems  Constitutional:  Negative for fatigue and fever.  HENT:  Negative for ear pain.   Eyes:  Negative for pain.  Respiratory:  Negative for cough and shortness of breath.   Cardiovascular:  Negative for chest pain, palpitations and leg swelling.  Gastrointestinal:  Negative for abdominal pain.  Genitourinary:  Negative for dysuria.  Musculoskeletal:  Negative for arthralgias.  Neurological:  Negative for syncope, light-headedness and headaches.  Psychiatric/Behavioral:  Negative for dysphoric mood.   Objective:  BP 110/72   Pulse 75   Temp 98 F (36.7 C) (Oral)  Ht 5' 9.5" (1.765 m)   Wt 202 lb (91.6 kg)   SpO2 96%   BMI 29.40 kg/m   Wt Readings from Last 3 Encounters:  12/12/21 202 lb (91.6 kg)  10/31/20 200 lb 4 oz (90.8 kg)  09/06/20 206 lb 8 oz (93.7 kg)      Physical Exam Constitutional:      General: He is not in acute distress.    Appearance: Normal appearance. He is well-developed. He is not ill-appearing or toxic-appearing.  HENT:     Head: Normocephalic and atraumatic.     Right Ear: Hearing, tympanic membrane, ear canal and external ear normal.     Left Ear: Hearing, tympanic membrane, ear canal and external ear normal.     Nose: Nose normal.     Mouth/Throat:     Pharynx: Uvula midline.  Eyes:     General: Lids are normal. Lids are everted, no foreign bodies appreciated.      Conjunctiva/sclera: Conjunctivae normal.     Pupils: Pupils are equal, round, and reactive to light.  Neck:     Thyroid: No thyroid mass or thyromegaly.     Vascular: No carotid bruit.     Trachea: Trachea and phonation normal.  Cardiovascular:     Rate and Rhythm: Normal rate and regular rhythm.     Pulses: Normal pulses.     Heart sounds: S1 normal and S2 normal. No murmur heard.   No gallop.  Pulmonary:     Breath sounds: Normal breath sounds. No wheezing, rhonchi or rales.  Abdominal:     General: Bowel sounds are normal.     Palpations: Abdomen is soft.     Tenderness: There is no abdominal tenderness. There is no guarding or rebound.     Hernia: No hernia is present.  Musculoskeletal:     Cervical back: Normal range of motion and neck supple.  Lymphadenopathy:     Cervical: No cervical adenopathy.  Skin:    General: Skin is warm and dry.     Findings: No rash.  Neurological:     Mental Status: He is alert.     Cranial Nerves: No cranial nerve deficit.     Sensory: No sensory deficit.     Gait: Gait normal.     Deep Tendon Reflexes: Reflexes are normal and symmetric.  Psychiatric:        Speech: Speech normal.        Behavior: Behavior normal.        Judgment: Judgment normal.      Results for orders placed or performed in visit on 12/05/21  PSA  Result Value Ref Range   PSA 0.30 0.10 - 4.00 ng/mL  Comprehensive metabolic panel  Result Value Ref Range   Sodium 139 135 - 145 mEq/L   Potassium 4.0 3.5 - 5.1 mEq/L   Chloride 103 96 - 112 mEq/L   CO2 29 19 - 32 mEq/L   Glucose, Bld 96 70 - 99 mg/dL   BUN 14 6 - 23 mg/dL   Creatinine, Ser 0.99 0.40 - 1.50 mg/dL   Total Bilirubin 0.9 0.2 - 1.2 mg/dL   Alkaline Phosphatase 83 39 - 117 U/L   AST 15 0 - 37 U/L   ALT 22 0 - 53 U/L   Total Protein 6.1 6.0 - 8.3 g/dL   Albumin 4.2 3.5 - 5.2 g/dL   GFR 87.85 >60.00 mL/min   Calcium 8.8 8.4 - 10.5 mg/dL  Lipid panel  Result Value Ref Range   Cholesterol 177 0 -  200 mg/dL   Triglycerides 159.0 (H) 0.0 - 149.0 mg/dL   HDL 32.80 (L) >39.00 mg/dL   VLDL 31.8 0.0 - 40.0 mg/dL   LDL Cholesterol 112 (H) 0 - 99 mg/dL   Total CHOL/HDL Ratio 5    NonHDL 143.86   Hemoglobin A1c  Result Value Ref Range   Hgb A1c MFr Bld 5.6 4.6 - 6.5 %    This visit occurred during the SARS-CoV-2 public health emergency.  Safety protocols were in place, including screening questions prior to the visit, additional usage of staff PPE, and extensive cleaning of exam room while observing appropriate contact time as indicated for disinfecting solutions.   COVID 19 screen:  No recent travel or known exposure to COVID19 The patient denies respiratory symptoms of COVID 19 at this time. The importance of social distancing was discussed today.   Assessment and Plan   The patient's preventative maintenance and recommended screening tests for an annual wellness exam were reviewed in full today. Brought up to date unless services declined.  Counselled on the importance of diet, exercise, and its role in overall health and mortality. The patient's FH and SH was reviewed, including their home life, tobacco status, and drug and alcohol status.   Vaccines: uptodate with flu, Shingrix x 2, Tdap.  COVID JJ x 1 in 10/2019.. had significant SE after vaccine for COVID Prostate Cancer Screen: no early fam history  Lab Results  Component Value Date   PSA 0.30 12/05/2021   PSA 0.49 08/30/2020  Colon Cancer Screen: no early fam history,  2019.. repeat in 5 years      Smoking Status: none ETOH/ drug use: none HIV screen:   refused.  Problem List Items Addressed This Visit     Eosinophilic colitis (Chronic)    Chronic, as difficult to control but he attempts to eat with diet.  Followed in the past by GI.      Essential hypertension (Chronic)    Stable, chronic.  Continue current medication.   Amlodipine 5 mg p.o. daily      Pure hypercholesterolemia (Chronic)    4.910-year  ASCVD risk score.  Encouraged work on low-cholesterol diet.      GAD (generalized anxiety disorder)   Routine general medical examination at a health care facility - Primary   Other Visit Diagnoses     Other fatigue       Relevant Orders   Vitamin B12 (Completed)   CBC with Differential/Platelet (Completed)   VITAMIN D 25 Hydroxy (Vit-D Deficiency, Fractures) (Completed)   TSH (Completed)   T4, free (Completed)   T3, free (Completed)   Seborrheic dermatitis            Eliezer Lofts, MD

## 2021-12-14 ENCOUNTER — Encounter: Payer: Self-pay | Admitting: Family Medicine

## 2021-12-16 MED ORDER — VITAMIN D3 1.25 MG (50000 UT) PO CAPS: 1.0000 | ORAL_CAPSULE | ORAL | 0 refills | Status: DC

## 2022-01-16 ENCOUNTER — Other Ambulatory Visit: Payer: Self-pay | Admitting: Family Medicine

## 2022-01-24 NOTE — Assessment & Plan Note (Signed)
4.910-year ASCVD risk score.  Encouraged work on low-cholesterol diet.

## 2022-01-24 NOTE — Assessment & Plan Note (Signed)
Chronic, as difficult to control but he attempts to eat with diet.  Followed in the past by GI.

## 2022-01-24 NOTE — Assessment & Plan Note (Signed)
Stable, chronic.  Continue current medication.  Amlodipine 5 mg p.o. daily 

## 2022-02-26 ENCOUNTER — Other Ambulatory Visit: Payer: Self-pay | Admitting: Family Medicine

## 2022-03-07 ENCOUNTER — Other Ambulatory Visit: Payer: Self-pay | Admitting: Family Medicine

## 2022-03-08 NOTE — Telephone Encounter (Signed)
Last office visit 12/12/21 for CPE.  Last refilled 12/16/21 for #12 with no refills.  Last Vit D level 12/12/21 which was low at 12.83 ng/mL.  No future appointments.

## 2022-04-20 ENCOUNTER — Other Ambulatory Visit: Payer: Self-pay | Admitting: Family Medicine

## 2022-04-20 NOTE — Telephone Encounter (Signed)
Last office visit 12/12/21 for CPE.  Last refilled 03/09/22 for #12 with no refills.  Last Vit D level 12/12/21 with was low at 12.83 ng/mL.  No future appointments.

## 2022-04-21 MED ORDER — VITAMIN D3 1.25 MG (50000 UT) PO CAPS: ORAL_CAPSULE | ORAL | 0 refills | Status: DC

## 2022-06-03 ENCOUNTER — Ambulatory Visit: Payer: BC Managed Care – PPO | Admitting: Family Medicine

## 2022-06-03 ENCOUNTER — Encounter: Payer: Self-pay | Admitting: Family Medicine

## 2022-06-03 ENCOUNTER — Encounter: Payer: Self-pay | Admitting: *Deleted

## 2022-06-03 VITALS — BP 128/80 | HR 91 | Temp 97.6°F | Ht 69.5 in | Wt 204.2 lb

## 2022-06-03 DIAGNOSIS — K5282 Eosinophilic colitis: Secondary | ICD-10-CM

## 2022-06-03 DIAGNOSIS — K769 Liver disease, unspecified: Secondary | ICD-10-CM

## 2022-06-03 DIAGNOSIS — R197 Diarrhea, unspecified: Secondary | ICD-10-CM

## 2022-06-03 DIAGNOSIS — F411 Generalized anxiety disorder: Secondary | ICD-10-CM | POA: Diagnosis not present

## 2022-06-03 MED ORDER — PREDNISONE 20 MG PO TABS: ORAL_TABLET | ORAL | 0 refills | Status: DC

## 2022-06-03 NOTE — Progress Notes (Signed)
Patient ID: Kyle Johnson, male    DOB: [birth year], 51 y.o.   MRN: [id number]  This visit was conducted in person.  BP 128/80   Pulse 91   Temp 97.6 F (36.4 C) (Temporal)   Ht 5' 9.5" (1.765 m)   Wt 204 lb 4 oz (92.6 kg)   SpO2 97%   BMI 29.73 kg/m    CC:  Chief Complaint  Patient presents with   Diarrhea    3-4 times a day x 2 weeks   Anxiety    Dealing with his health    Subjective:   HPI: Kyle Johnson is a 51 y.o. male presenting on 06/03/2022 for Diarrhea (3-4 times a day x 2 weeks) and Anxiety (Dealing with his health)  He has history of eosinophilic colitis.  He reports in the last 2 weeks increase in diarrhea 3-4 times a day. Mild abdominal pain relieved by BM... mid abdomen.  No known trigger. No fever, no blood in stool.  He is pushing liquids..60 oz a day of water.   No sick contacts. No travel , no well.   This has resulted in an increase in anxiety. Anxiety also is worsening her stool issue.  He is feeling fatigued. He is currently on sertraline 50 mg daily using Atarax 10 mg 3 times daily as needed.    GAD7: 2  PhQ: 5   Hepatic lesion noted in past, incidentally on renal CT.. was increased in size, now 13 mm ... pt concerned about malignancy. Will re-eval for size change with MRI liver.  Relevant past medical, surgical, family and social history reviewed and updated as indicated. Interim medical history since our last visit reviewed. Allergies and medications reviewed and updated. Outpatient Medications Prior to Visit  Medication Sig Dispense Refill   amLODipine (NORVASC) 5 MG tablet TAKE 1 TABLET (5 MG TOTAL) BY MOUTH DAILY. 90 tablet 3   ketoconazole (NIZORAL) 2 % cream Apply 1 application. topically daily. 30 g 0   levocetirizine (XYZAL) 5 MG tablet SMARTSIG:1 Tablet(s) By Mouth Every Evening     sertraline (ZOLOFT) 50 MG tablet TAKE 1 TABLET BY MOUTH EVERY DAY 90 tablet 1   Cholecalciferol (VITAMIN D3) 1.25 MG (50000 UT) CAPS 1  tablet weekly x 12 weeks 12 capsule 0   hydrOXYzine (ATARAX/VISTARIL) 10 MG tablet Take 1 tablet (10 mg total) by mouth 3 (three) times daily as needed. 30 tablet 0   No facility-administered medications prior to visit.     Per HPI unless specifically indicated in ROS section below Review of Systems  Constitutional:  Negative for fatigue and fever.  HENT:  Negative for ear pain.   Eyes:  Negative for pain.  Respiratory:  Negative for cough and shortness of breath.   Cardiovascular:  Negative for chest pain, palpitations and leg swelling.  Gastrointestinal:  Negative for abdominal pain.  Genitourinary:  Negative for dysuria.  Musculoskeletal:  Negative for arthralgias.  Neurological:  Negative for syncope, light-headedness and headaches.  Psychiatric/Behavioral:  Negative for dysphoric mood.    Objective:  BP 128/80   Pulse 91   Temp 97.6 F (36.4 C) (Temporal)   Ht 5' 9.5" (1.765 m)   Wt 204 lb 4 oz (92.6 kg)   SpO2 97%   BMI 29.73 kg/m   Wt Readings from Last 3 Encounters:  06/03/22 204 lb 4 oz (92.6 kg)  12/12/21 202 lb (91.6 kg)  10/31/20 200 lb 4 oz (90.8 kg)  Physical Exam Constitutional:      Appearance: He is well-developed.  HENT:     Head: Normocephalic.     Right Ear: Hearing normal.     Left Ear: Hearing normal.     Nose: Nose normal.  Neck:     Thyroid: No thyroid mass or thyromegaly.     Vascular: No carotid bruit.     Trachea: Trachea normal.  Cardiovascular:     Rate and Rhythm: Normal rate and regular rhythm.     Pulses: Normal pulses.     Heart sounds: Heart sounds not distant. No murmur heard.    No friction rub. No gallop.     Comments: No peripheral edema Pulmonary:     Effort: Pulmonary effort is normal. No respiratory distress.     Breath sounds: Normal breath sounds.  Skin:    General: Skin is warm and dry.     Findings: No rash.  Psychiatric:        Speech: Speech normal.        Behavior: Behavior normal.        Thought  Content: Thought content normal.       Results for orders placed or performed in visit on 12/12/21  Vitamin B12  Result Value Ref Range   Vitamin B-12 237 211 - 911 pg/mL  CBC with Differential/Platelet  Result Value Ref Range   WBC 6.7 4.0 - 10.5 K/uL   RBC 5.49 4.22 - 5.81 Mil/uL   Hemoglobin 15.2 13.0 - 17.0 g/dL   HCT 45.8 39.0 - 52.0 %   MCV 83.4 78.0 - 100.0 fl   MCHC 33.2 30.0 - 36.0 g/dL   RDW 13.3 11.5 - 15.5 %   Platelets 205.0 150.0 - 400.0 K/uL   Neutrophils Relative % 67.3 43.0 - 77.0 %   Lymphocytes Relative 21.2 12.0 - 46.0 %   Monocytes Relative 7.5 3.0 - 12.0 %   Eosinophils Relative 2.7 0.0 - 5.0 %   Basophils Relative 1.3 0.0 - 3.0 %   Neutro Abs 4.5 1.4 - 7.7 K/uL   Lymphs Abs 1.4 0.7 - 4.0 K/uL   Monocytes Absolute 0.5 0.1 - 1.0 K/uL   Eosinophils Absolute 0.2 0.0 - 0.7 K/uL   Basophils Absolute 0.1 0.0 - 0.1 K/uL  VITAMIN D 25 Hydroxy (Vit-D Deficiency, Fractures)  Result Value Ref Range   VITD 12.83 (L) 30.00 - 100.00 ng/mL  TSH  Result Value Ref Range   TSH 1.23 0.35 - 5.50 uIU/mL  T4, free  Result Value Ref Range   Free T4 0.83 0.60 - 1.60 ng/dL  T3, free  Result Value Ref Range   T3, Free 3.5 2.3 - 4.2 pg/mL     COVID 19 screen:  No recent travel or known exposure to COVID19 The patient denies respiratory symptoms of COVID 19 at this time. The importance of social distancing was discussed today.   Assessment and Plan    Problem List Items Addressed This Visit     Eosinophilic colitis (Chronic)    Chronic with likely acute flare resulting in acute diarrhea.  Will treat with prednisone 20 mg daily for 1 to 2 weeks.  We will follow up in 1 week for reevaluation.      Relevant Orders   GI pathogen panel by PCR, stool   Acute diarrhea   GAD (generalized anxiety disorder) - Primary    Chronic with acute worsening.  We discussed how this could be worsening his diarrhea symptoms as  well as increasing his worry about other health concerns.   His anxiety does not improve with improvement of his eosinophilic colitis we will consider increasing his dose of sertraline to 100 mg p.o. daily.  He will follow-up in 1 week.  He can use Atarax as needed for panic attack.      Hepatic lesion    Patient remains concerned for possibility of malignancy given increase in size of hepatic cyst on the recent last 2 CTs. The cysts was most recently greater than 13 mm.   I will move ahead with an MRI of the liver to evaluate for change in size.      Other Visit Diagnoses     Liver disease       Relevant Orders   MR LIVER WO CONRTAST      Meds ordered this encounter  Medications   predniSONE (DELTASONE) 20 MG tablet    Sig: 1  tablet daily for 7-10 days    Dispense:  10 tablet    Refill:  0     Eliezer Lofts, MD

## 2022-06-03 NOTE — Patient Instructions (Signed)
Start prednisone for 20 mg daily x 7 days.  Please stop at the lab to have labs drawn.

## 2022-06-03 NOTE — Assessment & Plan Note (Signed)
Chronic with acute worsening.  We discussed how this could be worsening his diarrhea symptoms as well as increasing his worry about other health concerns.  His anxiety does not improve with improvement of his eosinophilic colitis we will consider increasing his dose of sertraline to 100 mg p.o. daily.  He will follow-up in 1 week.  He can use Atarax as needed for panic attack.

## 2022-06-03 NOTE — Assessment & Plan Note (Signed)
Chronic with likely acute flare resulting in acute diarrhea.  Will treat with prednisone 20 mg daily for 1 to 2 weeks.  We will follow up in 1 week for reevaluation.

## 2022-06-03 NOTE — Assessment & Plan Note (Signed)
Patient remains concerned for possibility of malignancy given increase in size of hepatic cyst on the recent last 2 CTs. The cysts was most recently greater than 13 mm.   I will move ahead with an MRI of the liver to evaluate for change in size.

## 2022-06-19 ENCOUNTER — Other Ambulatory Visit: Payer: Self-pay | Admitting: Radiology

## 2022-06-19 DIAGNOSIS — R197 Diarrhea, unspecified: Secondary | ICD-10-CM

## 2022-06-22 LAB — GASTROINTESTINAL PATHOGEN PNL
CampyloBacter Group: NOT DETECTED
Norovirus GI/GII: NOT DETECTED
Rotavirus A: NOT DETECTED
Salmonella species: NOT DETECTED
Shiga Toxin 1: NOT DETECTED
Shiga Toxin 2: NOT DETECTED
Shigella Species: NOT DETECTED
Vibrio Group: NOT DETECTED
Yersinia enterocolitica: NOT DETECTED

## 2022-07-03 ENCOUNTER — Ambulatory Visit
Admission: RE | Admit: 2022-07-03 | Discharge: 2022-07-03 | Disposition: A | Discharge status: Home / Self Care | Payer: BC Managed Care – PPO | Source: Ambulatory Visit | Attending: Family Medicine | Admitting: Family Medicine

## 2022-07-03 DIAGNOSIS — K769 Liver disease, unspecified: Secondary | ICD-10-CM | POA: Diagnosis present

## 2022-07-03 MED ORDER — GADOBUTROL 1 MMOL/ML IV SOLN
9.0000 mL | Freq: Once | INTRAVENOUS | Status: AC | PRN
  Administered 2022-07-03: 9 mL via INTRAVENOUS

## 2022-08-28 ENCOUNTER — Other Ambulatory Visit: Payer: Self-pay | Admitting: Family Medicine

## 2022-09-23 ENCOUNTER — Ambulatory Visit: Payer: BC Managed Care – PPO | Admitting: Family Medicine

## 2022-09-23 ENCOUNTER — Encounter: Payer: Self-pay | Admitting: Family Medicine

## 2022-09-23 VITALS — BP 120/76 | HR 104 | Temp 97.6°F | Ht 69.5 in | Wt 209.0 lb

## 2022-09-23 DIAGNOSIS — R051 Acute cough: Secondary | ICD-10-CM | POA: Diagnosis not present

## 2022-09-23 DIAGNOSIS — E559 Vitamin D deficiency, unspecified: Secondary | ICD-10-CM | POA: Insufficient documentation

## 2022-09-23 LAB — POC COVID19 BINAXNOW: SARS Coronavirus 2 Ag: NEGATIVE

## 2022-09-23 LAB — VITAMIN D 25 HYDROXY (VIT D DEFICIENCY, FRACTURES): VITD: 25.39 ng/mL — ABNORMAL LOW (ref 30.00–100.00)

## 2022-09-23 MED ORDER — GUAIFENESIN-CODEINE 100-10 MG/5ML PO SYRP: 5.0000 mL | ORAL_SOLUTION | Freq: Every evening | ORAL | 0 refills | Status: DC | PRN

## 2022-09-23 NOTE — Assessment & Plan Note (Signed)
Vitamin D was low in the past and it is unclear if she ever took vitamin D supplementation.  We will recheck today given he has continued severe fatigue

## 2022-09-23 NOTE — Progress Notes (Signed)
Patient ID: Kyle Johnson, male    DOB: 01-02-1971, 52 y.o.   MRN: [id number]  This visit was conducted in person.  BP 120/76 (BP Location: Left Arm, Patient Position: Sitting, Cuff Size: Normal)   Pulse (!) 104   Temp 97.6 F (36.4 C) (Temporal)   Ht 5' 9.5" (1.765 m)   Wt 209 lb (94.8 kg)   SpO2 96%   BMI 30.42 kg/m    CC:  Chief Complaint  Patient presents with   chest congestion    X 3 days. Has been taking robitussin for sx. Has not done any testing.     Subjective:   HPI: Kyle Johnson is a 52 y.o. male presenting on 09/23/2022 for chest congestion (X 3 days. Has been taking robitussin for sx. Has not done any testing. )   Date of onset: 3 days ago Initial symptoms included head congestion, headache. Symptoms progressed to fatigue and chest congestion   Now productive cough, clear.. cough keeping him up at night  Some burning in chest with coughing  No ST,  no ear pain, no sinus pain. Some ear pressure.   No fever, some body ache.. left side form coighing.   Sick contacts: none COVID testing:   none   He has tried to treat with  robitussin    No history of chronic lung disease such as COPD. Told in past when around allergies, strong smells feels wheezy/SOB Non-smoker.       Relevant past medical, surgical, family and social history reviewed and updated as indicated. Interim medical history since our last visit reviewed. Allergies and medications reviewed and updated. Outpatient Medications Prior to Visit  Medication Sig Dispense Refill   amLODipine (NORVASC) 5 MG tablet TAKE 1 TABLET (5 MG TOTAL) BY MOUTH DAILY. 90 tablet 3   Iron, Ferrous Sulfate, 325 (65 Fe) MG TABS Take by mouth.     ketoconazole (NIZORAL) 2 % cream Apply 1 application. topically daily. 30 g 0   levocetirizine (XYZAL) 5 MG tablet SMARTSIG:1 Tablet(s) By Mouth Every Evening     sertraline (ZOLOFT) 50 MG tablet TAKE 1 TABLET BY MOUTH EVERY DAY 90 tablet 0   predniSONE  (DELTASONE) 20 MG tablet 1  tablet daily for 7-10 days (Patient not taking: Reported on 09/23/2022) 10 tablet 0   No facility-administered medications prior to visit.     Per HPI unless specifically indicated in ROS section below Review of Systems  Constitutional:  Positive for fatigue. Negative for fever.  HENT:  Negative for ear pain.   Eyes:  Negative for pain.  Respiratory:  Positive for cough. Negative for shortness of breath.   Cardiovascular:  Negative for chest pain, palpitations and leg swelling.  Gastrointestinal:  Negative for abdominal pain.  Genitourinary:  Negative for dysuria.  Musculoskeletal:  Negative for arthralgias.  Neurological:  Negative for syncope, light-headedness and headaches.  Psychiatric/Behavioral:  Negative for dysphoric mood.    Objective:  BP 120/76 (BP Location: Left Arm, Patient Position: Sitting, Cuff Size: Normal)   Pulse (!) 104   Temp 97.6 F (36.4 C) (Temporal)   Ht 5' 9.5" (1.765 m)   Wt 209 lb (94.8 kg)   SpO2 96%   BMI 30.42 kg/m   Wt Readings from Last 3 Encounters:  09/23/22 209 lb (94.8 kg)  06/03/22 204 lb 4 oz (92.6 kg)  12/12/21 202 lb (91.6 kg)      Physical Exam Constitutional:      Appearance: He  is well-developed.  HENT:     Head: Normocephalic.     Right Ear: Hearing normal. No middle ear effusion. Tympanic membrane is not injected.     Left Ear: Hearing normal.  No middle ear effusion. Tympanic membrane is not injected.     Nose: Nose normal.  Neck:     Thyroid: No thyroid mass or thyromegaly.     Vascular: No carotid bruit.     Trachea: Trachea normal.  Cardiovascular:     Rate and Rhythm: Normal rate and regular rhythm.     Pulses: Normal pulses.     Heart sounds: Heart sounds not distant. No murmur heard.    No friction rub. No gallop.     Comments: No peripheral edema Pulmonary:     Effort: Pulmonary effort is normal. No respiratory distress.     Breath sounds: Normal breath sounds.  Skin:    General:  Skin is warm and dry.     Findings: No rash.  Psychiatric:        Speech: Speech normal.        Behavior: Behavior normal.        Thought Content: Thought content normal.       Results for orders placed or performed in visit on 09/23/22  POC COVID-19  Result Value Ref Range   SARS Coronavirus 2 Ag Negative Negative    Assessment and Plan  Acute cough Assessment & Plan: Most likely viral URI.  COVID testing negative in office today. No sign of bacterial superinfection at this time. Recommend fluids, symptomatic care and rest.  ER and return precautions provided.  Orders: -     POC COVID-19 BinaxNow  Vitamin D deficiency Assessment & Plan: Vitamin D was low in the past and it is unclear if she ever took vitamin D supplementation.  We will recheck today given he has continued severe fatigue  Orders: -     VITAMIN D 25 Hydroxy (Vit-D Deficiency, Fractures)  Other orders -     guaiFENesin-Codeine; Take 5-10 mLs by mouth at bedtime as needed for cough.  Dispense: 180 mL; Refill: 0    Return if symptoms worsen or fail to improve.   Eliezer Lofts, MD

## 2022-09-23 NOTE — Assessment & Plan Note (Signed)
Most likely viral URI.  COVID testing negative in office today. No sign of bacterial superinfection at this time. Recommend fluids, symptomatic care and rest.  ER and return precautions provided.

## 2022-09-23 NOTE — Patient Instructions (Signed)
Rest, fluids.  Can use cough suppressant at night as needed.  Please stop at the lab to have labs drawn.

## 2022-09-24 ENCOUNTER — Other Ambulatory Visit: Payer: Self-pay | Admitting: Family Medicine

## 2022-09-24 ENCOUNTER — Encounter: Payer: Self-pay | Admitting: Family Medicine

## 2022-09-24 MED ORDER — VITAMIN D3 1.25 MG (50000 UT) PO CAPS: 1.0000 | ORAL_CAPSULE | ORAL | 0 refills | Status: DC

## 2022-10-30 ENCOUNTER — Telehealth: Payer: Self-pay | Admitting: Family Medicine

## 2022-10-30 NOTE — Telephone Encounter (Signed)
Patient was prescribed Cephalexin today at the dentist. He called in today to make sure it was ok to take since he his allergic to penicillin? Please advise.

## 2022-10-30 NOTE — Telephone Encounter (Signed)
Kyle Johnson notified by telephone that it is okay for him to take the Cephalexin as prescribed, per Dr. Diona Browner.

## 2022-11-26 ENCOUNTER — Other Ambulatory Visit: Payer: Self-pay | Admitting: Family Medicine

## 2022-11-26 NOTE — Telephone Encounter (Signed)
Spoke to pt, scheduled cpe 01/08/23

## 2022-11-26 NOTE — Telephone Encounter (Signed)
Please call and schedule CPE with fasting labs prior with Dr. Ermalene Searing after 12/13/2022.

## 2023-01-08 ENCOUNTER — Encounter: Payer: BC Managed Care – PPO | Admitting: Family Medicine

## 2023-01-23 ENCOUNTER — Other Ambulatory Visit: Payer: Self-pay | Admitting: Family Medicine

## 2023-03-03 ENCOUNTER — Other Ambulatory Visit: Payer: Self-pay | Admitting: Family Medicine

## 2023-03-03 NOTE — Telephone Encounter (Signed)
Patient has been scheduled

## 2023-03-03 NOTE — Telephone Encounter (Signed)
Please schedule CPE with fasting labs prior with Dr. Ermalene Searing.  Please send back to me once scheduled to refill medication.

## 2023-03-03 NOTE — Telephone Encounter (Signed)
LVM for patient to c/b and schedule.  

## 2023-03-12 ENCOUNTER — Encounter: Payer: Self-pay | Admitting: Family Medicine

## 2023-03-12 ENCOUNTER — Ambulatory Visit (INDEPENDENT_AMBULATORY_CARE_PROVIDER_SITE_OTHER): Payer: BC Managed Care – PPO | Admitting: Family Medicine

## 2023-03-12 VITALS — BP 120/70 | HR 87 | Temp 98.0°F | Ht 68.75 in | Wt 208.1 lb

## 2023-03-12 DIAGNOSIS — Z0000 Encounter for general adult medical examination without abnormal findings: Secondary | ICD-10-CM | POA: Diagnosis not present

## 2023-03-12 DIAGNOSIS — K5282 Eosinophilic colitis: Secondary | ICD-10-CM

## 2023-03-12 DIAGNOSIS — I10 Essential (primary) hypertension: Secondary | ICD-10-CM | POA: Diagnosis not present

## 2023-03-12 DIAGNOSIS — E7800 Pure hypercholesterolemia, unspecified: Secondary | ICD-10-CM | POA: Diagnosis not present

## 2023-03-12 DIAGNOSIS — Z125 Encounter for screening for malignant neoplasm of prostate: Secondary | ICD-10-CM

## 2023-03-12 DIAGNOSIS — R7303 Prediabetes: Secondary | ICD-10-CM

## 2023-03-12 DIAGNOSIS — E559 Vitamin D deficiency, unspecified: Secondary | ICD-10-CM

## 2023-03-12 MED ORDER — KETOCONAZOLE 2 % EX CREA: 1.0000 | TOPICAL_CREAM | Freq: Every day | CUTANEOUS | 0 refills | Status: AC

## 2023-03-12 NOTE — Patient Instructions (Addendum)
Apply topical diclofenac cream to left  lateral foot.  Apply ice and elevate when you can.  Start home PT for foot sprain  Wear supportive shoes.  Call to set up colonoscopy with Dr. Norville Haggard

## 2023-03-12 NOTE — Progress Notes (Signed)
Patient ID: Kyle Johnson, male    DOB: [birth year], 52 y.o.   MRN: [id number]  This visit was conducted in person.  BP 120/70 (BP Location: Left Arm, Patient Position: Sitting, Cuff Size: Large)   Pulse 87   Temp 98 F (36.7 C) (Temporal)   Ht 5' 8.75" (1.746 m)   Wt 208 lb 2 oz (94.4 kg)   SpO2 96%   BMI 30.96 kg/m    CC:  Chief Complaint  Patient presents with   Annual Exam    Subjective:   HPI: Kyle Johnson is a 52 y.o. male presenting on 03/12/2023 for Annual Exam   Limited diet given eosinophilic colitis.  Continues to be very tired.   2 month left lateral foot pain  Wears boots at work.   Reviewed labs in detail with patient   GAD:  Good control.. weaned of sertraline  and is feeling better, feels more motivated off the sertraline. Flowsheet Row Office Visit from 09/23/2022 in [facility]  PHQ-2 Total Score 0         Hypertension:    Good control on amlodipine 5 mg daily BP Readings from Last 3 Encounters:  03/12/23 120/70  09/23/22 120/76  06/03/22 128/80  Using medication without problems or lightheadedness:  none Chest pain with exertion: none Edema:none Short of breath: none Average home BPs: Other issues:  Elevated Cholesterol:  Lab Results  Component Value Date   CHOL 176 03/12/2023   HDL 36 (L) 03/12/2023   LDLCALC 106 (H) 03/12/2023   TRIG 215 (H) 03/12/2023   CHOLHDL 4.9 03/12/2023  Using medications without problems: The 10-year ASCVD risk score (Arnett DK, et al., 2019) is: 5.6%   Values used to calculate the score:     Age: 87 years     Sex: Male     Is Non-Hispanic African American: No     Diabetic: No     Tobacco smoker: No     Systolic Blood Pressure: 120 mmHg     Is BP treated: Yes     HDL Cholesterol: 36 mg/dL     Total Cholesterol: 176 mg/dL Muscle aches:  Diet compliance: limited Exercise: walking at work Other complaints:         Relevant past medical, surgical,  family and social history reviewed and updated as indicated. Interim medical history since our last visit reviewed. Allergies and medications reviewed and updated. Outpatient Medications Prior to Visit  Medication Sig Dispense Refill   amLODipine (NORVASC) 5 MG tablet TAKE 1 TABLET (5 MG TOTAL) BY MOUTH DAILY. 90 tablet 3   levocetirizine (XYZAL) 5 MG tablet SMARTSIG:1 Tablet(s) By Mouth Every Evening     ketoconazole (NIZORAL) 2 % cream Apply 1 application. topically daily. 30 g 0   Cholecalciferol (VITAMIN D3) 1.25 MG (50000 UT) CAPS Take 1 capsule (1.25 mg total) by mouth once a week. 12 capsule 0   guaiFENesin-codeine (ROBITUSSIN AC) 100-10 MG/5ML syrup Take 5-10 mLs by mouth at bedtime as needed for cough. 180 mL 0   Iron, Ferrous Sulfate, 325 (65 Fe) MG TABS Take by mouth.     sertraline (ZOLOFT) 50 MG tablet TAKE 1 TABLET BY MOUTH EVERY DAY (Patient not taking: Reported on 03/12/2023) 90 tablet 0   No facility-administered medications prior to visit.     Per HPI unless specifically indicated in ROS section below Review of Systems  Constitutional:  Negative for fatigue and fever.  HENT:  Negative for ear pain.   Eyes:  Negative for pain.  Respiratory:  Negative for cough and shortness of breath.   Cardiovascular:  Negative for chest pain, palpitations and leg swelling.  Gastrointestinal:  Negative for abdominal pain.  Genitourinary:  Negative for dysuria.  Musculoskeletal:  Negative for arthralgias.  Neurological:  Negative for syncope, light-headedness and headaches.  Psychiatric/Behavioral:  Negative for dysphoric mood.    Objective:  BP 120/70 (BP Location: Left Arm, Patient Position: Sitting, Cuff Size: Large)   Pulse 87   Temp 98 F (36.7 C) (Temporal)   Ht 5' 8.75" (1.746 m)   Wt 208 lb 2 oz (94.4 kg)   SpO2 96%   BMI 30.96 kg/m   Wt Readings from Last 3 Encounters:  03/12/23 208 lb 2 oz (94.4 kg)  09/23/22 209 lb (94.8 kg)  06/03/22 204 lb 4 oz (92.6 kg)       Physical Exam Constitutional:      General: He is not in acute distress.    Appearance: Normal appearance. He is well-developed. He is not ill-appearing or toxic-appearing.  HENT:     Head: Normocephalic and atraumatic.     Right Ear: Hearing, tympanic membrane, ear canal and external ear normal.     Left Ear: Hearing, tympanic membrane, ear canal and external ear normal.     Nose: Nose normal.     Mouth/Throat:     Pharynx: Uvula midline.  Eyes:     General: Lids are normal. Lids are everted, no foreign bodies appreciated.     Conjunctiva/sclera: Conjunctivae normal.     Pupils: Pupils are equal, round, and reactive to light.  Neck:     Thyroid: No thyroid mass or thyromegaly.     Vascular: No carotid bruit.     Trachea: Trachea and phonation normal.  Cardiovascular:     Rate and Rhythm: Normal rate and regular rhythm.     Pulses: Normal pulses.     Heart sounds: S1 normal and S2 normal. No murmur heard.    No gallop.  Pulmonary:     Breath sounds: Normal breath sounds. No wheezing, rhonchi or rales.  Abdominal:     General: Bowel sounds are normal.     Palpations: Abdomen is soft.     Tenderness: There is no abdominal tenderness. There is no guarding or rebound.     Hernia: No hernia is present.  Musculoskeletal:     Cervical back: Normal range of motion and neck supple.  Lymphadenopathy:     Cervical: No cervical adenopathy.  Skin:    General: Skin is warm and dry.     Findings: No rash.  Neurological:     Mental Status: He is alert.     Cranial Nerves: No cranial nerve deficit.     Sensory: No sensory deficit.     Gait: Gait normal.     Deep Tendon Reflexes: Reflexes are normal and symmetric.  Psychiatric:        Speech: Speech normal.        Behavior: Behavior normal.        Judgment: Judgment normal.       Results for orders placed or performed in visit on 03/12/23  Hemoglobin A1c  Result Value Ref Range   Hgb A1c MFr Bld 5.8 (H) <5.7 % of total Hgb    Mean Plasma Glucose 120 mg/dL   eAG (mmol/L) 6.6 mmol/L  Lipid panel  Result Value Ref Range   Cholesterol 176 <200  mg/dL   HDL 36 (L) > OR = 40 mg/dL   Triglycerides 782 (H) <150 mg/dL   LDL Cholesterol (Calc) 106 (H) mg/dL (calc)   Total CHOL/HDL Ratio 4.9 <5.0 (calc)   Non-HDL Cholesterol (Calc) 140 (H) <130 mg/dL (calc)  Comprehensive metabolic panel  Result Value Ref Range   Glucose, Bld 98 65 - 99 mg/dL   BUN 14 7 - 25 mg/dL   Creat 9.56 (H) 2.13 - 1.30 mg/dL   BUN/Creatinine Ratio 9 6 - 22 (calc)   Sodium 141 135 - 146 mmol/L   Potassium 4.1 3.5 - 5.3 mmol/L   Chloride 103 98 - 110 mmol/L   CO2 27 20 - 32 mmol/L   Calcium 9.1 8.6 - 10.3 mg/dL   Total Protein 6.0 (L) 6.1 - 8.1 g/dL   Albumin 4.2 3.6 - 5.1 g/dL   Globulin 1.8 (L) 1.9 - 3.7 g/dL (calc)   AG Ratio 2.3 1.0 - 2.5 (calc)   Total Bilirubin 0.5 0.2 - 1.2 mg/dL   Alkaline phosphatase (APISO) 75 35 - 144 U/L   AST 13 10 - 35 U/L   ALT 25 9 - 46 U/L  VITAMIN D 25 Hydroxy (Vit-D Deficiency, Fractures)  Result Value Ref Range   Vit D, 25-Hydroxy 26 (L) 30 - 100 ng/mL  PSA  Result Value Ref Range   PSA 0.32 < OR = 4.00 ng/mL    This visit occurred during the SARS-CoV-2 public health emergency.  Safety protocols were in place, including screening questions prior to the visit, additional usage of staff PPE, and extensive cleaning of exam room while observing appropriate contact time as indicated for disinfecting solutions.   COVID 19 screen:  No recent travel or known exposure to COVID19 The patient denies respiratory symptoms of COVID 19 at this time. The importance of social distancing was discussed today.   Assessment and Plan   The patient's preventative maintenance and recommended screening tests for an annual wellness exam were reviewed in full today. Brought up to date unless services declined.  Counselled on the importance of diet, exercise, and its role in overall health and mortality. The patient's FH  and SH was reviewed, including their home life, tobacco status, and drug and alcohol status.   Vaccines: uptodate with flu, Shingrix x 2, Tdap.  COVID JJ x 1 in 10/2019.. had significant SE after vaccine for COVID Prostate Cancer Screen: no early fam history  Lab Results  Component Value Date   PSA 0.32 03/12/2023   PSA 0.30 12/05/2021   PSA 0.49 08/30/2020  Colon Cancer Screen: no early fam history,  2019.. repeat in 5 years      Smoking Status: none ETOH/ drug use: none HIV screen:   refused.  Problem List Items Addressed This Visit     Eosinophilic colitis (Chronic)    Chronic , tolerable control but causes very restricted diet.      Essential hypertension (Chronic)    Stable, chronic.  Continue current medication.   Amlodipine 5 mg p.o. daily      Prediabetes    Due for reevaluation      Relevant Orders   Hemoglobin A1c (Completed)   Pure hypercholesterolemia (Chronic)    6 10-year ASCVD risk score at last check.  Due for reevaluation. encouraged work on low-cholesterol diet.      Relevant Orders   Comprehensive metabolic panel (Completed)   Lipid panel (Completed)   Routine general medical examination at a health care facility -  Primary   Vitamin D deficiency    Due for reevaluation      Relevant Orders   VITAMIN D 25 Hydroxy (Vit-D Deficiency, Fractures) (Completed)   Other Visit Diagnoses     Prostate cancer screening       Relevant Orders   PSA (Completed)         Kerby Nora, MD

## 2023-03-23 NOTE — Assessment & Plan Note (Addendum)
6 10-year ASCVD risk score at last check.  Due for reevaluation. encouraged work on low-cholesterol diet.

## 2023-03-23 NOTE — Assessment & Plan Note (Signed)
Chronic , tolerable control but causes very restricted diet.

## 2023-03-23 NOTE — Assessment & Plan Note (Signed)
Due for reevaluation 

## 2023-03-23 NOTE — Assessment & Plan Note (Signed)
Stable, chronic.  Continue current medication.  Amlodipine 5 mg p.o. daily

## 2023-04-02 ENCOUNTER — Encounter: Payer: Self-pay | Admitting: Family Medicine

## 2023-04-02 ENCOUNTER — Ambulatory Visit: Payer: BC Managed Care – PPO | Admitting: Family Medicine

## 2023-04-02 VITALS — BP 110/62 | HR 87 | Temp 98.1°F | Ht 68.75 in | Wt 208.5 lb

## 2023-04-02 DIAGNOSIS — M549 Dorsalgia, unspecified: Secondary | ICD-10-CM | POA: Insufficient documentation

## 2023-04-02 MED ORDER — CYCLOBENZAPRINE HCL 10 MG PO TABS: 10.0000 mg | ORAL_TABLET | Freq: Every evening | ORAL | 0 refills | Status: DC | PRN

## 2023-04-02 MED ORDER — MELOXICAM 15 MG PO TABS: 15.0000 mg | ORAL_TABLET | Freq: Every day | ORAL | 0 refills | Status: DC

## 2023-04-02 NOTE — Assessment & Plan Note (Signed)
Acute,  most likely muscle strain, paraspinous muscles. Treat with heat or ice, start home physical therapy gentle stretching, meloxicam 7.5 to 15 mg daily for inflammation and cyclobenzaprine half to 1 full tablet p.o. nightly as needed muscle spasm.  No red flags and no indication for x-ray.  Return and ER precautions provided.

## 2023-04-02 NOTE — Patient Instructions (Addendum)
Start home physical therapy.  No heavy lifting, or repetitive twisting.  Start muscle relaxant at bedtime.  Start meloxicam 15 mg  daily for inflammation.  Follow up if not improving as expected in 2 weeks.

## 2023-04-02 NOTE — Progress Notes (Addendum)
Patient ID: Kyle Johnson, male    DOB: 05-Apr-1971, 52 y.o.   MRN: [id number]  This visit was conducted in person.  BP 110/62 (BP Location: Right Arm, Patient Position: Sitting, Cuff Size: Large)   Pulse 87   Temp 98.1 F (36.7 C) (Temporal)   Ht 5' 8.75" (1.746 m)   Wt 208 lb 8 oz (94.6 kg)   SpO2 97%   BMI 31.01 kg/m    CC:  Chief Complaint  Patient presents with   Back Pain    X 2 days    Subjective:   HPI: Kyle Johnson is a 52 y.o. male presenting on 04/02/2023 for Back Pain (X 2 days)  New onset  upper back pain ongoing x 2 days.     Right  upper back pain. Sharp pain.  Worse with lying down. Decreased ROM.  Some radiation of pain to the left.  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  No known injury   No numbness, no weakness.  Has tried to treat with rest and tylenol twice daily... helped some temporarily.  Heat does not help for him.    No fever, no incontinence.   No poastr back surgeries.  Relevant past medical, surgical, family and social history reviewed and updated as indicated. Interim medical history since our last visit reviewed. Allergies and medications reviewed and updated. Outpatient Medications Prior to Visit  Medication Sig Dispense Refill   amLODipine (NORVASC) 5 MG tablet TAKE 1 TABLET (5 MG TOTAL) BY MOUTH DAILY. 90 tablet 3   ketoconazole (NIZORAL) 2 % cream Apply 1 Application topically daily. 30 g 0   levocetirizine (XYZAL) 5 MG tablet SMARTSIG:1 Tablet(s) By Mouth Every Evening     No facility-administered medications prior to visit.     Per HPI unless specifically indicated in ROS section below Review of Systems  Constitutional:  Negative for fatigue and fever.  HENT:  Negative for ear pain.   Eyes:  Negative for pain.  Respiratory:  Negative for cough and shortness of breath.   Cardiovascular:  Negative for chest pain, palpitations and leg swelling.  Gastrointestinal:   Negative for abdominal pain.  Genitourinary:  Negative for dysuria.  Musculoskeletal:  Negative for arthralgias.  Neurological:  Negative for syncope, light-headedness and headaches.  Psychiatric/Behavioral:  Negative for dysphoric mood.    Objective:  BP 110/62 (BP Location: Right Arm, Patient Position: Sitting, Cuff Size: Large)   Pulse 87   Temp 98.1 F (36.7 C) (Temporal)   Ht 5' 8.75" (1.746 m)   Wt 208 lb 8 oz (94.6 kg)   SpO2 97%   BMI 31.01 kg/m   Wt Readings from Last 3 Encounters:  04/02/23 208 lb 8 oz (94.6 kg)  03/12/23 208 lb 2 oz (94.4 kg)  09/23/22 209 lb (94.8 kg)      Physical Exam Constitutional:      Appearance: He is well-developed.  HENT:     Head: Normocephalic.     Right Ear: Hearing normal.     Left Ear: Hearing normal.     Nose: Nose normal.  Neck:     Thyroid: No thyroid mass or thyromegaly.     Vascular: No carotid bruit.     Trachea: Trachea normal.  Cardiovascular:     Rate and Rhythm: Normal rate and regular rhythm.     Pulses: Normal pulses.     Heart sounds: Heart sounds not distant.  No murmur heard.    No friction rub. No gallop.     Comments: No peripheral edema Pulmonary:     Effort: Pulmonary effort is normal. No respiratory distress.     Breath sounds: Normal breath sounds.  Musculoskeletal:     Cervical back: Normal. No bony tenderness.     Thoracic back: Tenderness present. No bony tenderness. Decreased range of motion.     Lumbar back: Normal. Normal range of motion.  Skin:    General: Skin is warm and dry.     Findings: No rash.  Neurological:     Mental Status: He is oriented to person, place, and time.     Cranial Nerves: Cranial nerves 2-12 are intact.     Sensory: Sensation is intact.     Motor: Motor function is intact.  Psychiatric:        Speech: Speech normal.        Behavior: Behavior normal.        Thought Content: Thought content normal.       Results for orders placed or performed in visit on 03/12/23   Hemoglobin A1c  Result Value Ref Range   Hgb A1c MFr Bld 5.8 (H) <5.7 % of total Hgb   Mean Plasma Glucose 120 mg/dL   eAG (mmol/L) 6.6 mmol/L  Lipid panel  Result Value Ref Range   Cholesterol 176 <200 mg/dL   HDL 36 (L) > OR = 40 mg/dL   Triglycerides 629 (H) <150 mg/dL   LDL Cholesterol (Calc) 106 (H) mg/dL (calc)   Total CHOL/HDL Ratio 4.9 <5.0 (calc)   Non-HDL Cholesterol (Calc) 140 (H) <130 mg/dL (calc)  Comprehensive metabolic panel  Result Value Ref Range   Glucose, Bld 98 65 - 99 mg/dL   BUN 14 7 - 25 mg/dL   Creat 5.28 (H) 4.13 - 1.30 mg/dL   BUN/Creatinine Ratio 9 6 - 22 (calc)   Sodium 141 135 - 146 mmol/L   Potassium 4.1 3.5 - 5.3 mmol/L   Chloride 103 98 - 110 mmol/L   CO2 27 20 - 32 mmol/L   Calcium 9.1 8.6 - 10.3 mg/dL   Total Protein 6.0 (L) 6.1 - 8.1 g/dL   Albumin 4.2 3.6 - 5.1 g/dL   Globulin 1.8 (L) 1.9 - 3.7 g/dL (calc)   AG Ratio 2.3 1.0 - 2.5 (calc)   Total Bilirubin 0.5 0.2 - 1.2 mg/dL   Alkaline phosphatase (APISO) 75 35 - 144 U/L   AST 13 10 - 35 U/L   ALT 25 9 - 46 U/L  VITAMIN D 25 Hydroxy (Vit-D Deficiency, Fractures)  Result Value Ref Range   Vit D, 25-Hydroxy 26 (L) 30 - 100 ng/mL  PSA  Result Value Ref Range   PSA 0.32 < OR = 4.00 ng/mL    Assessment and Plan  Upper back pain on right side Assessment & Plan:  Acute,  most likely muscle strain, paraspinous muscles. Treat with heat or ice, start home physical therapy gentle stretching, meloxicam 7.5 to 15 mg daily for inflammation and cyclobenzaprine half to 1 full tablet p.o. nightly as needed muscle spasm.  No red flags and no indication for x-ray.  Return and ER precautions provided.   Other orders -     Cyclobenzaprine HCl; Take 1 tablet (10 mg total) by mouth at bedtime as needed for muscle spasms.  Dispense: 15 tablet; Refill: 0 -     Meloxicam; Take 1 tablet (15 mg total) by mouth daily.  Dispense: 15 tablet; Refill: 0    No follow-ups on file.   Kerby Nora, MD

## 2023-06-30 ENCOUNTER — Encounter: Payer: Self-pay | Admitting: Family Medicine

## 2023-06-30 ENCOUNTER — Ambulatory Visit: Payer: BC Managed Care – PPO | Admitting: Family Medicine

## 2023-06-30 VITALS — BP 106/62 | HR 96 | Temp 98.4°F | Ht 68.75 in | Wt 205.0 lb

## 2023-06-30 DIAGNOSIS — H6993 Unspecified Eustachian tube disorder, bilateral: Secondary | ICD-10-CM | POA: Diagnosis not present

## 2023-06-30 DIAGNOSIS — R051 Acute cough: Secondary | ICD-10-CM

## 2023-06-30 LAB — POC COVID19 BINAXNOW: SARS Coronavirus 2 Ag: NEGATIVE

## 2023-06-30 MED ORDER — GUAIFENESIN-CODEINE 100-10 MG/5ML PO SYRP: 5.0000 mL | ORAL_SOLUTION | Freq: Every evening | ORAL | 0 refills | Status: DC | PRN

## 2023-06-30 MED ORDER — BENZONATATE 200 MG PO CAPS: 200.0000 mg | ORAL_CAPSULE | Freq: Two times a day (BID) | ORAL | 0 refills | Status: DC | PRN

## 2023-06-30 NOTE — Patient Instructions (Signed)
Flonase 2 spray per nostril daily

## 2023-06-30 NOTE — Assessment & Plan Note (Signed)
Acute  Most consistent with viral upper respiratory tract infection, no clear evidence of bacterial superinfection. Recommended using cough suppressant for symptomatic care, push fluids, rest.  Can also start Flonase 2 sprays per nostril daily for eustachian tube dysfunction.  Return and ER precautions provided.  He will call if he is not improving by day 7 of illness for consideration of antibiotics or prednisone course.

## 2023-06-30 NOTE — Progress Notes (Signed)
Patient ID: SLONE FAZEL, male    DOB: 1971-02-19, 52 y.o.   MRN: [id number]  This visit was conducted in person.  BP 106/62 (BP Location: Left Arm, Patient Position: Sitting, Cuff Size: Normal)   Pulse 96   Temp 98.4 F (36.9 C) (Oral)   Ht 5' 8.75" (1.746 m)   Wt 205 lb (93 kg)   SpO2 98%   BMI 30.49 kg/m    CC:  Chief Complaint  Patient presents with   Nasal Congestion    And chest congestion, productive cough, aches in legs and hands, and dry throat since Monday. Has been taking robitussin and cough drops.     Subjective:   HPI: Kyle Johnson is a 52 y.o. male presenting on 06/30/2023 for Nasal Congestion (And chest congestion, productive cough, aches in legs and hands, and dry throat since Monday. Has been taking robitussin and cough drops. )   Date of onset:   3 days ago Initial symptoms included  dry scratchy throat Symptoms progressed to chest congestion, cough, productive  No fever  Headaches and knee aches.  Feeling tired.  Fullness in ear No ear pain, no face pain   Sick contacts:  no sick contacts, but Mom is in rehab COVID testing:   none     He has tried to treat with cough drops, robitussin     No history of chronic lung disease such as asthma or COPD. Non-smoker.  Eosinophilic colitis history      Relevant past medical, surgical, family and social history reviewed and updated as indicated. Interim medical history since our last visit reviewed. Allergies and medications reviewed and updated. Outpatient Medications Prior to Visit  Medication Sig Dispense Refill   amLODipine (NORVASC) 5 MG tablet TAKE 1 TABLET (5 MG TOTAL) BY MOUTH DAILY. 90 tablet 3   cyclobenzaprine (FLEXERIL) 10 MG tablet Take 1 tablet (10 mg total) by mouth at bedtime as needed for muscle spasms. 15 tablet 0   ketoconazole (NIZORAL) 2 % cream Apply 1 Application topically daily. 30 g 0   meloxicam (MOBIC) 15 MG tablet Take 1 tablet (15 mg total) by mouth daily. 15  tablet 0   levocetirizine (XYZAL) 5 MG tablet SMARTSIG:1 Tablet(s) By Mouth Every Evening     No facility-administered medications prior to visit.     Per HPI unless specifically indicated in ROS section below Review of Systems  Constitutional:  Negative for fatigue and fever.  HENT:  Positive for congestion, postnasal drip and sore throat. Negative for ear pain.   Eyes:  Negative for pain.  Respiratory:  Positive for cough. Negative for shortness of breath.   Cardiovascular:  Negative for chest pain, palpitations and leg swelling.  Gastrointestinal:  Negative for abdominal pain.  Genitourinary:  Negative for dysuria.  Musculoskeletal:  Negative for arthralgias.  Neurological:  Negative for syncope, light-headedness and headaches.  Psychiatric/Behavioral:  Negative for dysphoric mood.    Objective:  BP 106/62 (BP Location: Left Arm, Patient Position: Sitting, Cuff Size: Normal)   Pulse 96   Temp 98.4 F (36.9 C) (Oral)   Ht 5' 8.75" (1.746 m)   Wt 205 lb (93 kg)   SpO2 98%   BMI 30.49 kg/m   Wt Readings from Last 3 Encounters:  06/30/23 205 lb (93 kg)  04/02/23 208 lb 8 oz (94.6 kg)  03/12/23 208 lb 2 oz (94.4 kg)      Physical Exam Vitals reviewed.  Constitutional:  General: He is not in acute distress.    Appearance: Normal appearance. He is well-developed. He is not ill-appearing or toxic-appearing.  HENT:     Head: Normocephalic and atraumatic.     Right Ear: Hearing, ear canal and external ear normal. No tenderness. A middle ear effusion is present. No foreign body. Tympanic membrane is not retracted or bulging.     Left Ear: Hearing, ear canal and external ear normal. No tenderness. A middle ear effusion is present. No foreign body. Tympanic membrane is not retracted or bulging.     Nose: Nose normal. No mucosal edema or rhinorrhea.     Right Sinus: No maxillary sinus tenderness or frontal sinus tenderness.     Left Sinus: No maxillary sinus tenderness or  frontal sinus tenderness.     Mouth/Throat:     Mouth: Oropharynx is clear and moist and mucous membranes are normal.     Dentition: Normal dentition. No dental caries.     Pharynx: Uvula midline. No oropharyngeal exudate.     Tonsils: No tonsillar abscesses.  Eyes:     General: Lids are normal. Lids are everted, no foreign bodies appreciated.     Extraocular Movements: EOM normal.     Conjunctiva/sclera: Conjunctivae normal.     Pupils: Pupils are equal, round, and reactive to light.  Neck:     Thyroid: No thyroid mass or thyromegaly.     Vascular: No carotid bruit.     Trachea: Trachea and phonation normal.  Cardiovascular:     Rate and Rhythm: Normal rate and regular rhythm.     Pulses: Normal pulses.     Heart sounds: Normal heart sounds, S1 normal and S2 normal. Heart sounds not distant. No murmur heard.    No friction rub. No gallop.     Comments: No peripheral edema Pulmonary:     Effort: Pulmonary effort is normal. No respiratory distress.     Breath sounds: Normal breath sounds. No wheezing, rhonchi or rales.  Abdominal:     General: Bowel sounds are normal.     Palpations: Abdomen is soft.     Tenderness: There is no abdominal tenderness. There is no CVA tenderness, guarding or rebound.     Hernia: No hernia is present.  Musculoskeletal:     Cervical back: Normal range of motion and neck supple.  Skin:    General: Skin is warm, dry and intact.     Findings: No rash.  Neurological:     Mental Status: He is alert.     Deep Tendon Reflexes: Reflexes are normal and symmetric.  Psychiatric:        Mood and Affect: Mood and affect normal.        Speech: Speech normal.        Behavior: Behavior normal.        Thought Content: Thought content normal.        Judgment: Judgment normal.       Results for orders placed or performed in visit on 06/30/23  POC COVID-19  Result Value Ref Range   SARS Coronavirus 2 Ag Negative Negative    Assessment and Plan  Acute  cough Assessment & Plan:  Acute  Most consistent with viral upper respiratory tract infection, no clear evidence of bacterial superinfection. Recommended using cough suppressant for symptomatic care, push fluids, rest.  Can also start Flonase 2 sprays per nostril daily for eustachian tube dysfunction.  Return and ER precautions provided.  He will call if  he is not improving by day 7 of illness for consideration of antibiotics or prednisone course.  Orders: -     POC COVID-19 BinaxNow  Dysfunction of both eustachian tubes  Other orders -     Benzonatate; Take 1 capsule (200 mg total) by mouth 2 (two) times daily as needed for cough.  Dispense: 20 capsule; Refill: 0 -     guaiFENesin-Codeine; Take 5-10 mLs by mouth at bedtime as needed for cough.  Dispense: 180 mL; Refill: 0    Return if symptoms worsen or fail to improve.   Kerby Nora, MD

## 2023-07-01 ENCOUNTER — Telehealth: Payer: Self-pay | Admitting: Family Medicine

## 2023-07-01 NOTE — Telephone Encounter (Signed)
Updated work note sent to [employer] as instructed.

## 2023-07-01 NOTE — Telephone Encounter (Signed)
Patient would like to know if he could have his work note extended until 07/05/2023.He said that he will go back to work on Tuesday. Placed on mychart would be fine.

## 2023-07-01 NOTE — Telephone Encounter (Signed)
Okay to extend work note.

## 2024-02-16 ENCOUNTER — Other Ambulatory Visit: Payer: Self-pay | Admitting: Family Medicine

## 2024-02-16 NOTE — Telephone Encounter (Signed)
 Please schedule CPE with fasting labs piror with Dr. Avelina after 03/11/2024.

## 2024-02-16 NOTE — Telephone Encounter (Signed)
 Spoke to pt, sch cpe for 03/17/24

## 2024-02-23 ENCOUNTER — Ambulatory Visit: Admitting: Internal Medicine

## 2024-02-23 ENCOUNTER — Ambulatory Visit: Payer: Self-pay

## 2024-02-23 ENCOUNTER — Encounter: Payer: Self-pay | Admitting: Internal Medicine

## 2024-02-23 VITALS — BP 114/70 | HR 110 | Temp 98.7°F | Ht 68.75 in | Wt 194.0 lb

## 2024-02-23 DIAGNOSIS — J4520 Mild intermittent asthma, uncomplicated: Secondary | ICD-10-CM

## 2024-02-23 DIAGNOSIS — J45909 Unspecified asthma, uncomplicated: Secondary | ICD-10-CM | POA: Insufficient documentation

## 2024-02-23 MED ORDER — PREDNISONE 20 MG PO TABS: 40.0000 mg | ORAL_TABLET | Freq: Every day | ORAL | 0 refills | Status: DC

## 2024-02-23 NOTE — Progress Notes (Signed)
 Subjective:    Patient ID: Kyle Johnson, male    DOB: July 31, 1971, 53 y.o.   MRN: [id number]  HPI Here due to respiratory symptoms  Started 2-3 days ago--wheezing feeling in upper chest Tried robitussin--not really helping this time Gets spells of cough--some dark mucus at first, and now clear No fever, chills or sweats. Just feels hot when coughing Some DOE with moving around (due to cough) No headache No sore throat No ear pain  Current Outpatient Medications on File Prior to Visit  Medication Sig Dispense Refill   amLODipine  (NORVASC ) 5 MG tablet TAKE 1 TABLET (5 MG TOTAL) BY MOUTH DAILY. 90 tablet 0   cyclobenzaprine  (FLEXERIL ) 10 MG tablet Take 1 tablet (10 mg total) by mouth at bedtime as needed for muscle spasms. 15 tablet 0   ketoconazole  (NIZORAL ) 2 % cream Apply 1 Application topically daily. 30 g 0   meloxicam  (MOBIC ) 15 MG tablet Take 1 tablet (15 mg total) by mouth daily. 15 tablet 0   No current facility-administered medications on file prior to visit.    Allergies  Allergen Reactions   Aspirin     REACTION: Nausea and vomiting   Milk-Related Compounds     REACTION: GI   Other     Citrus, dairy, peppermint, chocolate, caffeine.  Foods r/t EoE   Penicillins     REACTION: Nausea and vomiting   Propoxyphene Hcl     REACTION: Rash   Tetanus Toxoids     Past Medical History:  Diagnosis Date   Eosinophilic colitis    GERD (gastroesophageal reflux disease)    Heart murmur    History of kidney stones    Hypertension     Past Surgical History:  Procedure Laterality Date   COLONOSCOPY WITH PROPOFOL  N/A 08/20/2017   Procedure: COLONOSCOPY WITH PROPOFOL ;  Surgeon: Janalyn Keene NOVAK, MD;  Location: [facility];  Service: Endoscopy;  Laterality: N/A;   ESOPHAGOGASTRODUODENOSCOPY (EGD) WITH PROPOFOL  N/A 08/20/2017   Procedure: ESOPHAGOGASTRODUODENOSCOPY (EGD) WITH PROPOFOL ;  Surgeon: Janalyn Keene NOVAK, MD;  Location: [facility];  Service:  Endoscopy;  Laterality: N/A;    Family History  Problem Relation Age of Onset   Hypertension Mother    Stroke Mother    Heart disease Maternal Grandfather    Dementia Maternal Grandfather     Social History   Socioeconomic History   Marital status: Married    Spouse name: Not on file   Number of children: Not on file   Years of education: Not on file   Highest education level: Not on file  Occupational History   Not on file  Tobacco Use   Smoking status: Never   Smokeless tobacco: Never  Vaping Use   Vaping status: Never Used  Substance and Sexual Activity   Alcohol use: No   Drug use: No   Sexual activity: Not on file  Other Topics Concern   Not on file  Social History Narrative   Not on file   Social Drivers of Health   Financial Resource Strain: Not on file  Food Insecurity: Not on file  Transportation Needs: Not on file  Physical Activity: Not on file  Stress: Not on file  Social Connections: Not on file  Intimate Partner Violence: Not on file   Review of Systems No loss of smell or taste No N/V Able to eat Asthma history---but no attacks     Objective:   Physical Exam Constitutional:      Appearance: Normal appearance.  HENT:     Head:     Comments: No sinus tenderness    Right Ear: Tympanic membrane and ear canal normal.     Left Ear: Tympanic membrane and ear canal normal.     Mouth/Throat:     Pharynx: No oropharyngeal exudate or posterior oropharyngeal erythema.  Pulmonary:     Effort: Pulmonary effort is normal.     Breath sounds: Normal breath sounds. No wheezing or rales.     Comments: Tight cough Musculoskeletal:     Cervical back: Neck supple.  Lymphadenopathy:     Cervical: No cervical adenopathy.  Neurological:     Mental Status: He is alert.            Assessment & Plan:

## 2024-02-23 NOTE — Assessment & Plan Note (Signed)
 Feels tight and cough is bad Discussed likely viral etiology Try prednisone  40mg  x 3 then 20mg  x 3 OTC analgesics and DM cough meds Benzonatate  hasn't been helpful If not better by Monday---would try empiric doxy 100 bid x 7 days

## 2024-02-23 NOTE — Telephone Encounter (Signed)
 FYI Only or Action Required?: FYI only for provider.  Patient was last seen in primary care on 06/30/2023 by Kyle Greig BRAVO, MD.  Called Nurse Triage reporting No chief complaint on file..  Symptoms began several days ago.  Interventions attempted: Prescription medications: Robitussin.  Symptoms are: gradually worsening.  Triage Disposition: See PCP When Office is Open (Within 3 Days)  Patient/caregiver understands and will follow disposition?: Yes   Copied from CRM [id number]. Topic: Clinical - Red Word Triage >> Feb 23, 2024  8:06 AM Kyle Johnson wrote: Red Word that prompted transfer to Nurse Triage: Productive cough, chest/head congestion 3x days, denies discolored sputum Reason for Disposition  Lots of coughing  Answer Assessment - Initial Assessment Questions 1. LOCATION: Where does it hurt?      Unsure  2. ONSET: When did the sinus pain start?  (e.g., hours, days)      3 Days ago  3. SEVERITY: How bad is the pain?   (Scale 1-10; mild, moderate or severe)   - MILD (1-3): doesn't interfere with normal activities    - MODERATE (4-7): interferes with normal activities (e.g., work or school) or awakens from sleep   - SEVERE (8-10): excruciating pain and patient unable to do any normal activities        Unsure  4. RECURRENT SYMPTOM: Have you ever had sinus problems before? If Yes, ask: When was the last time? and What happened that time?      Unsure  5. NASAL CONGESTION: Is the nose blocked? If Yes, ask: Can you open it or must you breathe through your mouth?     Yes  6. NASAL DISCHARGE: Do you have discharge from your nose? If so ask, What color?     No  7. FEVER: Do you have a fever? If Yes, ask: What is it, how was it measured, and when did it start?      No  8. OTHER SYMPTOMS: Do you have any other symptoms? (e.g., sore throat, cough, earache, difficulty breathing)     Cough  Spoke with wife for triage, not with the patient  Protocols  used: Sinus Pain or Congestion-A-AH

## 2024-02-23 NOTE — Telephone Encounter (Signed)
 Patient is scheduled to see Dr. Jimmy today.

## 2024-02-25 ENCOUNTER — Encounter: Payer: Self-pay | Admitting: Internal Medicine

## 2024-02-25 MED ORDER — DOXYCYCLINE HYCLATE 100 MG PO TABS
100.0000 mg | ORAL_TABLET | Freq: Two times a day (BID) | ORAL | 0 refills | Status: DC
Start: 2024-02-25 — End: 2024-03-17

## 2024-03-17 ENCOUNTER — Encounter: Payer: Self-pay | Admitting: Family Medicine

## 2024-03-17 ENCOUNTER — Ambulatory Visit: Payer: Self-pay | Admitting: Family Medicine

## 2024-03-17 ENCOUNTER — Ambulatory Visit (INDEPENDENT_AMBULATORY_CARE_PROVIDER_SITE_OTHER): Payer: Self-pay | Admitting: Family Medicine

## 2024-03-17 VITALS — BP 120/78 | HR 71 | Temp 97.7°F | Ht 69.0 in | Wt 195.2 lb

## 2024-03-17 DIAGNOSIS — R5383 Other fatigue: Secondary | ICD-10-CM

## 2024-03-17 DIAGNOSIS — E7800 Pure hypercholesterolemia, unspecified: Secondary | ICD-10-CM | POA: Diagnosis not present

## 2024-03-17 DIAGNOSIS — Z1211 Encounter for screening for malignant neoplasm of colon: Secondary | ICD-10-CM

## 2024-03-17 DIAGNOSIS — I10 Essential (primary) hypertension: Secondary | ICD-10-CM | POA: Diagnosis not present

## 2024-03-17 DIAGNOSIS — K5282 Eosinophilic colitis: Secondary | ICD-10-CM

## 2024-03-17 DIAGNOSIS — Z125 Encounter for screening for malignant neoplasm of prostate: Secondary | ICD-10-CM | POA: Diagnosis not present

## 2024-03-17 DIAGNOSIS — Z0000 Encounter for general adult medical examination without abnormal findings: Secondary | ICD-10-CM

## 2024-03-17 LAB — LIPID PANEL
Cholesterol: 165 mg/dL (ref 0–200)
HDL: 38.6 mg/dL — ABNORMAL LOW (ref >39.00)
LDL Cholesterol: 98 mg/dL (ref 0–99)
NonHDL: 126.79
Total CHOL/HDL Ratio: 4
Triglycerides: 146 mg/dL (ref 0.0–149.0)
VLDL: 29.2 mg/dL (ref 0.0–40.0)

## 2024-03-17 LAB — IBC + FERRITIN
Ferritin: 338.5 ng/mL — ABNORMAL HIGH (ref 22.0–322.0)
Iron: 77 ug/dL (ref 42–165)
Saturation Ratios: 25.5 % (ref 20.0–50.0)
TIBC: 302.4 ug/dL (ref 250.0–450.0)
Transferrin: 216 mg/dL (ref 212.0–360.0)

## 2024-03-17 LAB — COMPREHENSIVE METABOLIC PANEL WITH GFR
ALT: 21 U/L (ref 0–53)
AST: 14 U/L (ref 0–37)
Albumin: 4.4 g/dL (ref 3.5–5.2)
Alkaline Phosphatase: 74 U/L (ref 39–117)
BUN: 16 mg/dL (ref 6–23)
CO2: 28 meq/L (ref 19–32)
Calcium: 8.9 mg/dL (ref 8.4–10.5)
Chloride: 102 meq/L (ref 96–112)
Creatinine, Ser: 0.93 mg/dL (ref 0.40–1.50)
GFR: 93.19 mL/min (ref >60.00)
Glucose, Bld: 84 mg/dL (ref 70–99)
Potassium: 3.9 meq/L (ref 3.5–5.1)
Sodium: 137 meq/L (ref 135–145)
Total Bilirubin: 0.9 mg/dL (ref 0.2–1.2)
Total Protein: 6.3 g/dL (ref 6.0–8.3)

## 2024-03-17 LAB — VITAMIN D 25 HYDROXY (VIT D DEFICIENCY, FRACTURES): VITD: 25.3 ng/mL — ABNORMAL LOW (ref 30.00–100.00)

## 2024-03-17 LAB — VITAMIN B12: Vitamin B-12: 298 pg/mL (ref 211–911)

## 2024-03-17 LAB — PSA: PSA: 0.61 ng/mL (ref 0.10–4.00)

## 2024-03-17 NOTE — Assessment & Plan Note (Signed)
 Chronic, stable with limited diet... check  vitamins, iron given diet restriction.  Recommended follow up with GI... he will consider.   Offered FMLA for leave prn flares.

## 2024-03-17 NOTE — Assessment & Plan Note (Signed)
 Due for re-eval.

## 2024-03-17 NOTE — Assessment & Plan Note (Signed)
Stable, chronic.  Continue current medication.  Amlodipine 5 mg p.o. daily

## 2024-03-17 NOTE — Progress Notes (Signed)
 Patient ID: Kyle Johnson, male    DOB: 1971-06-27, 53 y.o.   MRN: [id number]  This visit was conducted in person.  BP 120/78   Pulse 71   Temp 97.7 F (36.5 C) (Temporal)   Ht 5' 9 (1.753 m)   Wt 195 lb 4 oz (88.6 kg)   SpO2 98%   BMI 28.83 kg/m    CC:  Chief Complaint  Patient presents with   Annual Exam    Subjective:   HPI: Kyle Johnson is a 53 y.o. male presenting on 03/17/2024 for Annual Exam   Limited diet given eosinophilic colitis.  Continues to be very tired.   Reviewed labs in detail with patient   GAD:  Good control.. weaned of sertraline   and is feeling better, feels more motivated off the sertraline . Flowsheet Row Office Visit from 02/23/2024 in [facility] Total Score 0   Wt Readings from Last 3 Encounters:  03/17/24 195 lb 4 oz (88.6 kg)  02/23/24 194 lb (88 kg)  06/30/23 205 lb (93 kg)      Hypertension:    Good control on amlodipine  5 mg daily BP Readings from Last 3 Encounters:  03/17/24 120/78  02/23/24 114/70  06/30/23 106/62  Using medication without problems or lightheadedness:  none Chest pain with exertion: none Edema:none Short of breath: none Average home BPs: Other issues:  Elevated Cholesterol: Due for reevaluation Lab Results  Component Value Date   CHOL 176 03/12/2023   HDL 36 (L) 03/12/2023   LDLCALC 106 (H) 03/12/2023   TRIG 215 (H) 03/12/2023   CHOLHDL 4.9 03/12/2023  Using medications without problems: The 10-year ASCVD risk score (Arnett DK, et al., 2019) is: 6.2%   Values used to calculate the score:     Age: 30 years     Clincally relevant sex: Male     Is Non-Hispanic African American: No     Diabetic: No     Tobacco smoker: No     Systolic Blood Pressure: 120 mmHg     Is BP treated: Yes     HDL Cholesterol: 36 mg/dL     Total Cholesterol: 176 mg/dL Muscle aches:  Diet compliance: limited Exercise: walking at work, 12,000 step per day. Other  complaints:         Relevant past medical, surgical, family and social history reviewed and updated as indicated. Interim medical history since our last visit reviewed. Allergies and medications reviewed and updated. Outpatient Medications Prior to Visit  Medication Sig Dispense Refill   amLODipine  (NORVASC ) 5 MG tablet TAKE 1 TABLET (5 MG TOTAL) BY MOUTH DAILY. 90 tablet 0   ketoconazole  (NIZORAL ) 2 % cream Apply 1 Application topically daily. 30 g 0   cyclobenzaprine  (FLEXERIL ) 10 MG tablet Take 1 tablet (10 mg total) by mouth at bedtime as needed for muscle spasms. 15 tablet 0   doxycycline  (VIBRA -TABS) 100 MG tablet Take 1 tablet (100 mg total) by mouth 2 (two) times daily. 14 tablet 0   meloxicam  (MOBIC ) 15 MG tablet Take 1 tablet (15 mg total) by mouth daily. 15 tablet 0   predniSONE  (DELTASONE ) 20 MG tablet Take 2 tablets (40 mg total) by mouth daily. For 3 days, then 1 tab daily for 3 days 9 tablet 0   No facility-administered medications prior to visit.     Per HPI unless specifically indicated in ROS section below Review of Systems  Constitutional:  Negative for  fatigue and fever.  HENT:  Negative for ear pain.   Eyes:  Negative for pain.  Respiratory:  Negative for cough and shortness of breath.   Cardiovascular:  Negative for chest pain, palpitations and leg swelling.  Gastrointestinal:  Negative for abdominal pain.  Genitourinary:  Negative for dysuria.  Musculoskeletal:  Negative for arthralgias.  Neurological:  Negative for syncope, light-headedness and headaches.  Psychiatric/Behavioral:  Negative for dysphoric mood.    Objective:  BP 120/78   Pulse 71   Temp 97.7 F (36.5 C) (Temporal)   Ht 5' 9 (1.753 m)   Wt 195 lb 4 oz (88.6 kg)   SpO2 98%   BMI 28.83 kg/m   Wt Readings from Last 3 Encounters:  03/17/24 195 lb 4 oz (88.6 kg)  02/23/24 194 lb (88 kg)  06/30/23 205 lb (93 kg)      Physical Exam Constitutional:      General: He is not in acute  distress.    Appearance: Normal appearance. He is well-developed. He is not ill-appearing or toxic-appearing.  HENT:     Head: Normocephalic and atraumatic.     Right Ear: Hearing, tympanic membrane, ear canal and external ear normal.     Left Ear: Hearing, tympanic membrane, ear canal and external ear normal.     Nose: Nose normal.     Mouth/Throat:     Pharynx: Uvula midline.  Eyes:     General: Lids are normal. Lids are everted, no foreign bodies appreciated.     Conjunctiva/sclera: Conjunctivae normal.     Pupils: Pupils are equal, round, and reactive to light.  Neck:     Thyroid : No thyroid  mass or thyromegaly.     Vascular: No carotid bruit.     Trachea: Trachea and phonation normal.  Cardiovascular:     Rate and Rhythm: Normal rate and regular rhythm.     Pulses: Normal pulses.     Heart sounds: S1 normal and S2 normal. No murmur heard.    No gallop.  Pulmonary:     Breath sounds: Normal breath sounds. No wheezing, rhonchi or rales.  Abdominal:     General: Bowel sounds are normal.     Palpations: Abdomen is soft.     Tenderness: There is no abdominal tenderness. There is no guarding or rebound.     Hernia: No hernia is present.  Musculoskeletal:     Cervical back: Normal range of motion and neck supple.  Lymphadenopathy:     Cervical: No cervical adenopathy.  Skin:    General: Skin is warm and dry.     Findings: No rash.  Neurological:     Mental Status: He is alert.     Cranial Nerves: No cranial nerve deficit.     Sensory: No sensory deficit.     Gait: Gait normal.     Deep Tendon Reflexes: Reflexes are normal and symmetric.  Psychiatric:        Speech: Speech normal.        Behavior: Behavior normal.        Judgment: Judgment normal.       Results for orders placed or performed in visit on 06/30/23  POC COVID-19   Collection Time: 06/30/23 11:41 AM  Result Value Ref Range   SARS Coronavirus 2 Ag Negative Negative    This visit occurred during the  SARS-CoV-2 public health emergency.  Safety protocols were in place, including screening questions prior to the visit, additional usage of staff PPE,  and extensive cleaning of exam room while observing appropriate contact time as indicated for disinfecting solutions.   COVID 19 screen:  No recent travel or known exposure to COVID19 The patient denies respiratory symptoms of COVID 19 at this time. The importance of social distancing was discussed today.   Assessment and Plan   The patient's preventative maintenance and recommended screening tests for an annual wellness exam were reviewed in full today. Brought up to date unless services declined.  Counselled on the importance of diet, exercise, and its role in overall health and mortality. The patient's FH and SH was reviewed, including their home life, tobacco status, and drug and alcohol status.   Vaccines: uptodate with flu, Shingrix x 2, Tdap.  COVID JJ x 1 in 10/2019.. had significant SE after vaccine for COVID Prostate Cancer Screen: no early fam history .SABRA DUE Lab Results  Component Value Date   PSA 0.32 03/12/2023   PSA 0.30 12/05/2021   PSA 0.49 08/30/2020  Colon Cancer Screen: no early fam history,  2019.. repeat in 5 years DUE... he would like to do Cologuard instead.      Smoking Status: none ETOH/ drug use: none HIV screen:   refused.  Problem List Items Addressed This Visit     Eosinophilic colitis (Chronic)    Chronic, stable with limited diet... check  vitamins, iron given diet restriction.  Recommended follow up with GI... he will consider.   Offered FMLA for leave prn flares.      Essential hypertension (Chronic)   Stable, chronic.  Continue current medication.   Amlodipine  5 mg p.o. daily      Pure hypercholesterolemia (Chronic)   Due for re-eval.      Relevant Orders   Lipid panel   Comprehensive metabolic panel with GFR   Routine general medical examination at a health care facility - Primary    Other Visit Diagnoses       Prostate cancer screening       Relevant Orders   PSA     Other fatigue       Relevant Orders   Vitamin B12   IBC + Ferritin   VITAMIN D  25 Hydroxy (Vit-D Deficiency, Fractures)     Colon cancer screening       Relevant Orders   Cologuard          Greig Ring, MD

## 2024-04-12 LAB — COLOGUARD: COLOGUARD: NEGATIVE

## 2024-05-14 ENCOUNTER — Other Ambulatory Visit: Payer: Self-pay | Admitting: Family Medicine
